# Patient Record
Sex: Male | Born: 1970 | Race: Black or African American | Hispanic: No | Marital: Single | Smoking: Current every day smoker
Health system: Southern US, Community
[De-identification: ages and names within clinical notes are randomized; demographics above are authoritative.]

## PROBLEM LIST (undated history)

## (undated) DIAGNOSIS — F1010 Alcohol abuse, uncomplicated: Secondary | ICD-10-CM

## (undated) DIAGNOSIS — F1410 Cocaine abuse, uncomplicated: Secondary | ICD-10-CM

## (undated) DIAGNOSIS — K859 Acute pancreatitis without necrosis or infection, unspecified: Secondary | ICD-10-CM

## (undated) HISTORY — PX: ABDOMINAL SURGERY: SHX537

---

## 2015-07-13 ENCOUNTER — Emergency Department (HOSPITAL_BASED_OUTPATIENT_CLINIC_OR_DEPARTMENT_OTHER)
Admission: EM | Admit: 2015-07-13 | Discharge: 2015-07-13 | Disposition: A | Discharge status: Home / Self Care | Payer: Self-pay | Attending: Emergency Medicine | Admitting: Emergency Medicine

## 2015-07-13 ENCOUNTER — Encounter (HOSPITAL_BASED_OUTPATIENT_CLINIC_OR_DEPARTMENT_OTHER): Payer: Self-pay

## 2015-07-13 DIAGNOSIS — R112 Nausea with vomiting, unspecified: Secondary | ICD-10-CM | POA: Insufficient documentation

## 2015-07-13 DIAGNOSIS — Z87891 Personal history of nicotine dependence: Secondary | ICD-10-CM | POA: Insufficient documentation

## 2015-07-13 DIAGNOSIS — K860 Alcohol-induced chronic pancreatitis: Secondary | ICD-10-CM | POA: Insufficient documentation

## 2015-07-13 HISTORY — DX: Acute pancreatitis without necrosis or infection, unspecified: K85.90

## 2015-07-13 HISTORY — DX: Alcohol abuse, uncomplicated: F10.10

## 2015-07-13 HISTORY — DX: Cocaine abuse, uncomplicated: F14.10

## 2015-07-13 LAB — ETHANOL: Alcohol, Ethyl (B): 29 mg/dL — ABNORMAL HIGH (ref <5)

## 2015-07-13 LAB — COMPREHENSIVE METABOLIC PANEL
ALBUMIN: 4.1 g/dL (ref 3.5–5.0)
ALT: 12 U/L — AB (ref 17–63)
AST: 21 U/L (ref 15–41)
Alkaline Phosphatase: 68 U/L (ref 38–126)
Anion gap: 9 (ref 5–15)
BUN: 8 mg/dL (ref 6–20)
CHLORIDE: 103 mmol/L (ref 101–111)
CO2: 27 mmol/L (ref 22–32)
CREATININE: 0.87 mg/dL (ref 0.61–1.24)
Calcium: 9.4 mg/dL (ref 8.9–10.3)
GFR calc Af Amer: >60 mL/min (ref >60)
GLUCOSE: 91 mg/dL (ref 65–99)
POTASSIUM: 3.5 mmol/L (ref 3.5–5.1)
SODIUM: 139 mmol/L (ref 135–145)
Total Bilirubin: 0.7 mg/dL (ref 0.3–1.2)
Total Protein: 7.9 g/dL (ref 6.5–8.1)

## 2015-07-13 LAB — CBC WITH DIFFERENTIAL/PLATELET
BASOS ABS: 0 10*3/uL (ref 0.0–0.1)
BASOS PCT: 0 %
EOS ABS: 0.1 10*3/uL (ref 0.0–0.7)
EOS PCT: 1 %
HCT: 37.1 % — ABNORMAL LOW (ref 39.0–52.0)
Hemoglobin: 12.9 g/dL — ABNORMAL LOW (ref 13.0–17.0)
LYMPHS PCT: 23 %
Lymphs Abs: 2.1 10*3/uL (ref 0.7–4.0)
MCH: 28.5 pg (ref 26.0–34.0)
MCHC: 34.8 g/dL (ref 30.0–36.0)
MCV: 82.1 fL (ref 78.0–100.0)
MONO ABS: 0.6 10*3/uL (ref 0.1–1.0)
Monocytes Relative: 7 %
Neutro Abs: 6.1 10*3/uL (ref 1.7–7.7)
Neutrophils Relative %: 69 %
PLATELETS: 294 10*3/uL (ref 150–400)
RBC: 4.52 MIL/uL (ref 4.22–5.81)
RDW: 16 % — AB (ref 11.5–15.5)
WBC: 8.9 10*3/uL (ref 4.0–10.5)

## 2015-07-13 LAB — LIPASE, BLOOD: LIPASE: 336 U/L — AB (ref 11–51)

## 2015-07-13 MED ORDER — ONDANSETRON HCL 4 MG/2ML IJ SOLN
4.0000 mg | Freq: Once | INTRAMUSCULAR | Status: AC
  Administered 2015-07-13: 4 mg via INTRAVENOUS
  Filled 2015-07-13: qty 2

## 2015-07-13 MED ORDER — MORPHINE SULFATE (PF) 4 MG/ML IV SOLN
4.0000 mg | Freq: Once | INTRAVENOUS | Status: DC
  Filled 2015-07-13: qty 1

## 2015-07-13 MED ORDER — IBUPROFEN 800 MG PO TABS: 800.0000 mg | ORAL_TABLET | Freq: Three times a day (TID) | ORAL | Status: AC

## 2015-07-13 MED ORDER — ONDANSETRON 4 MG PO TBDP: 4.0000 mg | ORAL_TABLET | Freq: Three times a day (TID) | ORAL | Status: AC | PRN

## 2015-07-13 MED ORDER — SODIUM CHLORIDE 0.9 % IV BOLUS (SEPSIS)
1000.0000 mL | Freq: Once | INTRAVENOUS | Status: AC
  Administered 2015-07-13: 1000 mL via INTRAVENOUS

## 2015-07-13 MED ORDER — KETOROLAC TROMETHAMINE 30 MG/ML IJ SOLN
30.0000 mg | Freq: Once | INTRAMUSCULAR | Status: AC
  Administered 2015-07-13: 30 mg via INTRAVENOUS
  Filled 2015-07-13: qty 1

## 2015-07-13 MED ORDER — OXYCODONE-ACETAMINOPHEN 5-325 MG PO TABS
2.0000 | ORAL_TABLET | Freq: Once | ORAL | Status: AC
  Administered 2015-07-13: 2 via ORAL
  Filled 2015-07-13: qty 2

## 2015-07-13 NOTE — ED Notes (Signed)
;  pt c/o "pancreatitis"-abd pain, n/v since yesterday-was [facility] yesterday

## 2015-07-13 NOTE — Discharge Instructions (Signed)
Take the prescribed medication as directed. You have to stop drinking alcohol if you ever want this pancreatitis to stop recurring. [facility]-- call to make appt.  This clinic is FREE! Return to the ED for new or worsening symptoms.

## 2015-07-13 NOTE — ED Provider Notes (Signed)
CSN: [id number]     Arrival date & time 07/13/15  1703 History  By signing my name below, I, Placido SouLogan Joldersma, attest that this documentation has been prepared under the direction and in the presence of Garlon HatchetLisa M Hien Perreira, PA-C. Electronically Signed: Placido SouLogan Joldersma, ED Scribe. 07/13/2015. 5:50 PM.   Chief Complaint  Patient presents with  . Abdominal Pain   The history is provided by the patient. No language interpreter was used.   HPI Comments: Travis Schroeder is a 45 y.o. male with a PMHx of pancreatitis and ETOH abuse who presents to the Emergency Department complaining of constant, moderate, epigastric pain x 3 days. Patient states he has been battling intermittent pancreatitis for the past 2 years.  Pt confirms that he was seen at [facility] and was admitted for pancreatitis and left yesterday morning stating "they did nothing for me but call me a drunk". He notes he consumed ETOH after leaving the hospital. His pain worsens with palpation. Pt reports associated n/v. 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 Pt denies having been seen by a PCP or GI doctor. He denies any other associated symptoms at this time.   Past Medical History  Diagnosis Date  . Pancreatitis   . ETOH abuse   . Cocaine abuse    History reviewed. No pertinent past surgical history. No family history on file. Social History  Substance Use Topics  . Smoking status: Current Every Day Smoker  . Smokeless tobacco: None  . Alcohol Use: Yes     Comment: daily    Review of Systems  Constitutional: Negative for fever and chills.  Gastrointestinal: Positive for nausea, vomiting and abdominal pain.  All other systems reviewed and are negative.  Allergies  Review of patient's allergies indicates no known allergies.  Home Medications   Prior to Admission medications   Not on File   BP 176/101 mmHg  Pulse 62  Temp(Src) 98.3 F (36.8 C) (Oral)  Resp 18  Ht 5\' 10"  (1.778 m)  Wt 130  lb (58.968 kg)  BMI 18.65 kg/m2  SpO2 100%    Physical Exam  Constitutional: He is oriented to person, place, and time. He appears well-developed and well-nourished.  Writhing around in bed, spitting into emesis bag, no active emesis, uncooperative with exam, extremely rude with vulgar language  HENT:  Head: Normocephalic and atraumatic.  Mouth/Throat: Oropharynx is clear and moist.  Eyes: Conjunctivae and EOM are normal. Pupils are equal, round, and reactive to light.  Neck: Normal range of motion.  Cardiovascular: Normal rate, regular rhythm and normal heart sounds.   Pulmonary/Chest: Effort normal and breath sounds normal. No respiratory distress.  Abdominal: Soft. Bowel sounds are normal. There is tenderness in the epigastric area. There is no rigidity, no guarding and no CVA tenderness.  Pushing my hand away during exam Tenderness in epigastrium without rebound or peritonitis  Musculoskeletal: Normal range of motion.  Neurological: He is alert and oriented to person, place, and time.  Skin: Skin is warm and dry.  Psychiatric: He has a normal mood and affect.  Nursing note and vitals reviewed.   ED Course  Procedures  DIAGNOSTIC STUDIES: Oxygen Saturation is 100% on RA, normal by my interpretation.    COORDINATION OF CARE: 5:47 PM Discussed next steps with pt. Pt verbalized understanding and is agreeable with the plan.   Labs Review Labs Reviewed  CBC WITH DIFFERENTIAL/PLATELET - Abnormal; Notable for the following:  Hemoglobin 12.9 (*)    HCT 37.1 (*)    RDW 16.0 (*)    All other components within normal limits  COMPREHENSIVE METABOLIC PANEL - Abnormal; Notable for the following:    ALT 12 (*)    All other components within normal limits  LIPASE, BLOOD - Abnormal; Notable for the following:    Lipase 336 (*)    All other components within normal limits  ETHANOL - Abnormal; Notable for the following:    Alcohol, Ethyl (B) 29 (*)    All other components within  normal limits    Imaging Review No results found. I have personally reviewed and evaluated these images and lab results as part of my medical decision-making.   EKG Interpretation None      MDM   Final diagnoses:  Alcohol-induced chronic pancreatitis (HCC)   45 year old male here with chronic alcohol induced pancreatitis. Patient has been extremely rude and very verbally abusive to staff throughout ED visit.  He is afebrile and nontoxic in appearance. He was uncooperative with exam and making it very difficult to assess. He does have some apparent tenderness in his epigastrium. Labs as above-- lipase elevated as well as ethanol. Patient was treated with IV fluids, Toradol, Zofran, and oral Percocet. He initially refused any medications that "weren't Dilaudid" but reluctantly took the oral medications. He was able to hold down medications and sprite without any further vomiting, currently lying in bed comfortably. Do not feel he needs repeat admission at this time.  I had a long discussion with patient regarding his chronic alcohol abuse and that he will continue to have recurrent episodes of pancreatitis if he continues to drink. He states he does not have any insurance or money to see a primary care doctor so [facility].  Rx motrin, zofran.  Discussed plan with patient, he/she acknowledged understanding and agreed with plan of care.  Return precautions given for new or worsening symptoms.  I personally performed the services described in this documentation, which was scribed in my presence. The recorded information has been reviewed and is accurate.  Garlon HatchetLisa M Layken Doenges, PA-C 07/13/15 2027  Vanetta MuldersScott Zackowski, MD 07/15/15 [phone]

## 2016-06-10 ENCOUNTER — Emergency Department (HOSPITAL_BASED_OUTPATIENT_CLINIC_OR_DEPARTMENT_OTHER)
Admission: EM | Admit: 2016-06-10 | Discharge: 2016-06-11 | Disposition: A | Discharge status: Other Acute Inpatient Hospital | Payer: Self-pay

## 2016-06-10 ENCOUNTER — Emergency Department (HOSPITAL_BASED_OUTPATIENT_CLINIC_OR_DEPARTMENT_OTHER): Payer: Self-pay

## 2016-06-10 ENCOUNTER — Encounter (HOSPITAL_BASED_OUTPATIENT_CLINIC_OR_DEPARTMENT_OTHER): Payer: Self-pay | Admitting: *Deleted

## 2016-06-10 DIAGNOSIS — K861 Other chronic pancreatitis: Secondary | ICD-10-CM

## 2016-06-10 DIAGNOSIS — F17200 Nicotine dependence, unspecified, uncomplicated: Secondary | ICD-10-CM | POA: Insufficient documentation

## 2016-06-10 DIAGNOSIS — K859 Acute pancreatitis without necrosis or infection, unspecified: Secondary | ICD-10-CM | POA: Insufficient documentation

## 2016-06-10 DIAGNOSIS — R52 Pain, unspecified: Secondary | ICD-10-CM

## 2016-06-10 LAB — COMPREHENSIVE METABOLIC PANEL
ALT: 17 U/L (ref 17–63)
ANION GAP: 10 (ref 5–15)
AST: 28 U/L (ref 15–41)
Albumin: 4.4 g/dL (ref 3.5–5.0)
Alkaline Phosphatase: 59 U/L (ref 38–126)
BUN: 8 mg/dL (ref 6–20)
CHLORIDE: 98 mmol/L — AB (ref 101–111)
CO2: 28 mmol/L (ref 22–32)
Calcium: 9.4 mg/dL (ref 8.9–10.3)
Creatinine, Ser: 0.73 mg/dL (ref 0.61–1.24)
GFR calc non Af Amer: >60 mL/min (ref >60)
Glucose, Bld: 81 mg/dL (ref 65–99)
POTASSIUM: 3.2 mmol/L — AB (ref 3.5–5.1)
SODIUM: 136 mmol/L (ref 135–145)
Total Bilirubin: 1.8 mg/dL — ABNORMAL HIGH (ref 0.3–1.2)
Total Protein: 7.9 g/dL (ref 6.5–8.1)

## 2016-06-10 LAB — URINALYSIS, ROUTINE W REFLEX MICROSCOPIC
Bilirubin Urine: NEGATIVE
Glucose, UA: NEGATIVE mg/dL
HGB URINE DIPSTICK: NEGATIVE
Ketones, ur: NEGATIVE mg/dL
Leukocytes, UA: NEGATIVE
Nitrite: NEGATIVE
Protein, ur: NEGATIVE mg/dL
SPECIFIC GRAVITY, URINE: 1.017 (ref 1.005–1.030)
pH: 8 (ref 5.0–8.0)

## 2016-06-10 LAB — CBC WITH DIFFERENTIAL/PLATELET
Basophils Absolute: 0.1 10*3/uL (ref 0.0–0.1)
Basophils Relative: 1 %
EOS ABS: 0.2 10*3/uL (ref 0.0–0.7)
Eosinophils Relative: 2 %
HCT: 33.7 % — ABNORMAL LOW (ref 39.0–52.0)
HEMOGLOBIN: 11.7 g/dL — AB (ref 13.0–17.0)
LYMPHS ABS: 3.1 10*3/uL (ref 0.7–4.0)
LYMPHS PCT: 27 %
MCH: 29.3 pg (ref 26.0–34.0)
MCHC: 34.7 g/dL (ref 30.0–36.0)
MCV: 84.3 fL (ref 78.0–100.0)
Monocytes Absolute: 0.6 10*3/uL (ref 0.1–1.0)
Monocytes Relative: 5 %
NEUTROS PCT: 65 %
Neutro Abs: 7.5 10*3/uL (ref 1.7–7.7)
Platelets: 419 10*3/uL — ABNORMAL HIGH (ref 150–400)
RBC: 4 MIL/uL — AB (ref 4.22–5.81)
RDW: 14.4 % (ref 11.5–15.5)
WBC: 11.5 10*3/uL — AB (ref 4.0–10.5)

## 2016-06-10 LAB — LIPASE, BLOOD: Lipase: 206 U/L — ABNORMAL HIGH (ref 11–51)

## 2016-06-10 MED ORDER — ONDANSETRON HCL 4 MG/2ML IJ SOLN
4.0000 mg | Freq: Once | INTRAMUSCULAR | Status: AC
  Administered 2016-06-10: 4 mg via INTRAVENOUS
  Filled 2016-06-10: qty 2

## 2016-06-10 MED ORDER — HYDROMORPHONE HCL 1 MG/ML IJ SOLN
1.0000 mg | Freq: Once | INTRAMUSCULAR | Status: AC
  Administered 2016-06-10: 1 mg via INTRAVENOUS
  Filled 2016-06-10: qty 1

## 2016-06-10 MED ORDER — GI COCKTAIL ~~LOC~~
30.0000 mL | Freq: Once | ORAL | Status: AC
  Administered 2016-06-10: 30 mL via ORAL
  Filled 2016-06-10: qty 30

## 2016-06-10 MED ORDER — SODIUM CHLORIDE 0.9 % IV BOLUS (SEPSIS)
1000.0000 mL | Freq: Once | INTRAVENOUS | Status: AC
  Administered 2016-06-10: 1000 mL via INTRAVENOUS

## 2016-06-10 MED ORDER — MORPHINE SULFATE (PF) 4 MG/ML IV SOLN
4.0000 mg | Freq: Once | INTRAVENOUS | Status: AC
  Administered 2016-06-10: 4 mg via INTRAVENOUS
  Filled 2016-06-10: qty 1

## 2016-06-10 NOTE — ED Triage Notes (Signed)
Abdominal pain. States he thinks he has pancreatitis. He was seen at [facility] a week ago and treated for pancreatitis. He had a cyst drained while in the hospital. States he was [facility] yesterday after eating fried food and given pain medication and sent home.

## 2016-06-10 NOTE — ED Provider Notes (Signed)
[facility] Provider Note   CSN: [id number] Arrival date & time: 06/10/16  1851  By signing my name below, I, Travis Schroeder, attest that this documentation has been prepared under the direction and in the presence of [name], PA-C. Electronically Signed: Deland Schroeder, ED Scribe. 06/10/16. 8:05 PM.  History   Chief Complaint Chief Complaint  Patient presents with  . Abdominal Pain   The history is provided by the patient. No language interpreter was used.   HPI Comments: Travis Schroeder is a 46 y.o. male, with a h/x of pancreatitis and EtOH abuse, who presents to the Emergency Department complaining of severe constant right abdominal pain and epigastric pain with radiating back pain. He indicates that he went to [facility] yesterday and was given pain medication (percocets), a CT, and was then sent home.  He was diagnosed with acute on chronic pancreatitis. Pt does indorse drinking a large beer but that is it. The pt's last normal BM was this morning. He reports of a PMHx of 3 cysts on top of pancreas that were drained last month at [facility] by IR. He has bee trying percocet and zofran at home with little relief. He denies any fever, Lightheadedness, dizziness, urinary symptoms, melena, hematochezia, diarrhea, chest pain, shortness of breath.  Past Medical History:  Diagnosis Date  . Cocaine abuse   . ETOH abuse   . Pancreatitis     There are no active problems to display for this patient.   History reviewed. No pertinent surgical history.     Home Medications    Prior to Admission medications   Medication Sig Start Date End Date Taking? Authorizing Provider  AMLODIPINE BESYLATE PO Take by mouth.   Yes [provider]  GABAPENTIN PO Take by mouth.   Yes [provider]  MAGNESIUM PO Take by mouth.   Yes [provider]  Oxycodone-Acetaminophen (PERCOCET PO) Take by mouth.   Yes [provider]  TRAMADOL HCL PO Take by mouth.   Yes [provider]  ibuprofen (ADVIL,MOTRIN) 800 MG tablet Take 1 tablet (800 mg total) by mouth 3 (three) times daily. 07/13/15   Garlon Hatchet, PA-C  ondansetron (ZOFRAN ODT) 4 MG disintegrating tablet Take 1 tablet (4 mg total) by mouth every 8 (eight) hours as needed for nausea. 07/13/15   Garlon Hatchet, PA-C    Family History No family history on file.  Social History Social History  Substance Use Topics  . Smoking status: Current Every Day Smoker  . Smokeless tobacco: Never Used  . Alcohol use Yes     Comment: daily     Allergies   Patient has no known allergies.   Review of Systems Review of Systems  Constitutional: Positive for appetite change. Negative for chills and fever.  Respiratory: Negative for shortness of breath.   Cardiovascular: Negative for chest pain.  Gastrointestinal: Positive for abdominal pain, nausea and vomiting. Negative for blood in stool and diarrhea.  Genitourinary: Negative for dysuria, flank pain, frequency, hematuria and urgency.  Skin: Negative.   Neurological: Negative for dizziness, light-headedness and headaches.     Physical Exam Updated Vital Signs BP (!) 142/97 (BP Location: Right Arm)   Pulse 87   Temp 98.7 F (37.1 C) (Oral)   Resp 18   Ht 5\' 10"  (1.778 m)   Wt 130 lb (59 kg)   SpO2 100%   BMI 18.65 kg/m   Physical Exam  Constitutional: He  is oriented to person, place, and time. He appears well-developed and well-nourished.  Rolling around in bed crying. In pain. Non-toxic appearing.  HENT:  Head: Normocephalic.  Eyes: EOM are normal.  Neck: Normal range of motion. Neck supple.  Cardiovascular: Normal rate, regular rhythm, normal heart sounds and intact distal pulses.   Pulmonary/Chest: Effort normal and breath sounds normal.  Abdominal: Soft. Bowel sounds are normal. He exhibits no distension. There is tenderness in the right upper quadrant, epigastric area  and left upper quadrant. There is guarding. There is no rigidity, no rebound and no CVA tenderness.  Tender to palpation epigastric region in RUQ. No  rebound or rigidity. No CVA tenderness. Bowel sounds are normal.  Musculoskeletal: Normal range of motion.  Neurological: He is alert and oriented to person, place, and time.  Skin: Skin is warm and dry. Capillary refill takes less than 2 seconds.  Psychiatric: He has a normal mood and affect.  Nursing note and vitals reviewed.    ED Treatments / Results   DIAGNOSTIC STUDIES: Oxygen Saturation is 100% on RA, normal by my interpretation.   COORDINATION OF CARE: 7:43 PM-Discussed next steps with pt. Pt verbalized understanding and is agreeable with the plan.   Labs (all labs ordered are listed, but only abnormal results are displayed) Labs Reviewed  COMPREHENSIVE METABOLIC PANEL - Abnormal; Notable for the following:       Result Value   Potassium 3.2 (*)    Chloride 98 (*)    Total Bilirubin 1.8 (*)    All other components within normal limits  LIPASE, BLOOD - Abnormal; Notable for the following:    Lipase 206 (*)    All other components within normal limits  CBC WITH DIFFERENTIAL/PLATELET - Abnormal; Notable for the following:    WBC 11.5 (*)    RBC 4.00 (*)    Hemoglobin 11.7 (*)    HCT 33.7 (*)    Platelets 419 (*)    All other components within normal limits  URINALYSIS, ROUTINE W REFLEX MICROSCOPIC    EKG  EKG Interpretation None       Radiology Dg Abdomen Acute W/chest  Result Date: 06/10/2016 CLINICAL DATA:  Right-sided abdominal pain EXAM: DG ABDOMEN ACUTE W/ 1V CHEST COMPARISON:  06/10/2016, 06/09/2016 FINDINGS: Single-view chest demonstrates no acute consolidation or effusion. Normal cardiomediastinal silhouette. Supine and upright views of the abdomen demonstrate no free air beneath the diaphragm. Nonobstructed bowel gas pattern. No abnormal calcification. IMPRESSION: Non obstructed bowel gas pattern. No  radiographic evidence for acute cardiopulmonary abnormality. Electronically Signed   By: Jasmine Pang M.D.   On: 06/10/2016 23:07   US Abdomen Limited Ruq  Result Date: 06/10/2016 CLINICAL DATA:  Severe abdominal pain EXAM: ULTRASOUND ABDOMEN LIMITED RIGHT UPPER QUADRANT COMPARISON:  CT 06/09/2016 FINDINGS: Gallbladder: No gallstones or wall thickening visualized. No sonographic Murphy sign noted by sonographer. Common bile duct: Diameter: 2 mm Liver: No focal lesion identified. Within normal limits in parenchymal echogenicity. Intermittently visualized cyst in the upper abdomen compatible with known pseudocyst. IMPRESSION: 1. Negative for gallstones or biliary dilatation 2. The patient's known pseudocyst is visualized on some of the images. Electronically Signed   By: Jasmine Pang M.D.   On: 06/10/2016 22:13    Procedures Procedures (including critical care time)  Medications Ordered in ED Medications  HYDROmorphone (DILAUDID) injection 0.5 mg (not administered)  potassium chloride SA (K-DUR,KLOR-CON) CR tablet 40 mEq (not administered)  ketorolac (TORADOL) 30 MG/ML injection 15 mg (not administered)  ondansetron (  ZOFRAN) injection 4 mg (4 mg Intravenous Given 06/10/16 1954)  HYDROmorphone (DILAUDID) injection 1 mg (1 mg Intravenous Given 06/10/16 1956)  sodium chloride 0.9 % bolus 1,000 mL (0 mLs Intravenous Stopped 06/10/16 2245)  HYDROmorphone (DILAUDID) injection 1 mg (1 mg Intravenous Given 06/10/16 2052)  gi cocktail (Maalox,Lidocaine,Donnatal) (30 mLs Oral Given 06/10/16 2144)  morphine 4 MG/ML injection 4 mg (4 mg Intravenous Given 06/10/16 2244)  morphine 4 MG/ML injection 4 mg (4 mg Intravenous Given 06/11/16 0038)  ondansetron (ZOFRAN) injection 4 mg (4 mg Intravenous Given 06/11/16 0038)     Initial Impression / Assessment and Plan / ED Course  I have reviewed the triage vital signs and the nursing notes.  Pertinent labs & imaging results that were available during my care of the patient  were reviewed by me and considered in my medical decision making (see chart for details).     Patient presents to the emergency department today with complaints of epigastric upper quadrant pain that has been ongoing since yesterday. Patient was seen at [facility] and diagnosed with acute on chronic pancreatitis. He was given pain medicine and nausea medicine which has not been helping his symptoms at home. Return to the ED for ongoing symptoms. The patient is nontoxic appearing. Vital signs are stable. He is afebrile. On exam patient is tender in the epigastric right upper quadrant region. Patient with history of pseudocyst that was drained by interventional radiology at [facility] last month. Abdomen is not rigid and no rebound noted. Patient does seem to be a significant amount of pain.  Labs returned. Mild elevated white count of 11,000. Hemoglobin is stable. Potassium slightly low at 3.2 and placed orally. UA shows no signs of infection. Lipase elevated at 206. Patient did have a CT [facility] at [facility] yesterday that did show acute pancreatitis with known pseudocyst that have slightly enlarged in size from prior CT scan. Do not feel that another CT scan is warranted at this time. Did obtain A RIGHT UPPER QUADRANT WITHOUT ANY GALLBLADDER ABNORMALITIES WERE NORMAL. X-RAY OF ABDOMEN DID NOT SHOW ANY FREE AIR CONCERNING FOR PERFORATION.  Patient has been given several rounds of pain medicine and nausea medicine and continues to be in a significant amount of pain. He is also complaining of persistent nausea and unable to keep anything down by mouth. The patient is to be admitted for pain control and nausea. Spoke with Dr. Jaci StandardKakarkandy with [facility] medicine who feels patient would benefit more from being admitted to [facility] which is where his pseudocyst were drained. The patient refused at first be transferred to [facility]. Very vocal about not being transported. Discussed that since he had  his surgery performed there that they're more equipped. Patient agrees to speak to [facility].  Spoke with Dr. Andrey CampanileWilson from [facility] who agrees to accept patient in transport to hospital bed for admission. Awaiting bed assignment and Transport at This Time.   Was called in by nursing staff this patient was being very vocal to them. Patient was using curse words per nursing staff. Went into room to de-escalate patient the patient was very loud and that probably he has been treated. Discussed the patient cannot treat nursing staff that way that they were making sure that his IV was patent. Patient states that he's continued to be in pain and we have not been treating his pain. Informed patient that I have ordered several rounds of pain medication. Discussed with pt that  I would ordered some more pain medicine but he has received quite a lot of medicine and needs to be seen by the hospitalist. Pt is calm and cooperative at this time.   EMTALA orders placed. Pt is currently hemodynamically stable and in NAD at this time.    Final Clinical Impressions(s) / ED Diagnoses   Final diagnoses:  Pain  Acute on chronic pancreatitis [facility])    New Prescriptions New Prescriptions   No medications on file   I personally performed the services described in this documentation, which was scribed in my presence. The recorded information has been reviewed and is accurate.     [name], PA-C 06/11/16 0145    Pricilla Loveless, MD 06/13/16 [phone]

## 2016-06-10 NOTE — ED Notes (Signed)
EMT attempted vs, but pt in US.

## 2016-06-10 NOTE — ED Notes (Signed)
Pt still complaint of abdominal pain.  He states that the pain med eases the pain in his back.

## 2016-06-10 NOTE — ED Notes (Signed)
Explained to pt. That the EDP will be in to speak with him after results are back.  Pt. Said he wanted more pain meds.  Pt. Received meds while in [place] earlier.

## 2016-06-10 NOTE — ED Notes (Signed)
Pt. In no distress with no shortness of breath noted.

## 2016-06-11 MED ORDER — POTASSIUM CHLORIDE CRYS ER 20 MEQ PO TBCR
40.0000 meq | EXTENDED_RELEASE_TABLET | Freq: Once | ORAL | Status: AC
  Administered 2016-06-11: 40 meq via ORAL
  Filled 2016-06-11: qty 2

## 2016-06-11 MED ORDER — HYDROMORPHONE HCL 1 MG/ML IJ SOLN
0.5000 mg | Freq: Once | INTRAMUSCULAR | Status: AC | PRN
  Administered 2016-06-11: 0.5 mg via INTRAVENOUS
  Filled 2016-06-11: qty 1

## 2016-06-11 MED ORDER — MORPHINE SULFATE (PF) 4 MG/ML IV SOLN
4.0000 mg | Freq: Once | INTRAVENOUS | Status: AC
Start: 2016-06-11 — End: 2016-06-11
  Administered 2016-06-11: 4 mg via INTRAVENOUS
  Filled 2016-06-11: qty 1

## 2016-06-11 MED ORDER — ONDANSETRON HCL 4 MG/2ML IJ SOLN
4.0000 mg | Freq: Once | INTRAMUSCULAR | Status: AC
  Administered 2016-06-11: 4 mg via INTRAVENOUS
  Filled 2016-06-11: qty 2

## 2016-06-11 MED ORDER — HYDROMORPHONE HCL 1 MG/ML IJ SOLN
0.5000 mg | Freq: Once | INTRAMUSCULAR | Status: AC
  Administered 2016-06-11: 0.5 mg via INTRAVENOUS
  Filled 2016-06-11: qty 1

## 2016-06-11 MED ORDER — KETOROLAC TROMETHAMINE 30 MG/ML IJ SOLN
15.0000 mg | Freq: Once | INTRAMUSCULAR | Status: AC
  Administered 2016-06-11: 15 mg via INTRAVENOUS
  Filled 2016-06-11: qty 1

## 2016-06-11 NOTE — ED Notes (Signed)
Phone report given to [facility] by Vinson MoselleSam Coble, RN

## 2016-06-11 NOTE — ED Notes (Signed)
Patient requested another pain med.  He was informed that he just got Morphine and Zofran at 1238 am.  He stated that it did not help but slight relief.

## 2016-06-11 NOTE — ED Notes (Signed)
ED Provider at bedside. Dr Eudelia Bunchardama at bedside to assess pt prior to transfer.

## 2016-06-11 NOTE — ED Notes (Addendum)
At approx.0025 RN Earlene Plateravis in to give Pt. Pain meds and nausea meds.  RN attempted to flush Pt. IV site.  Site did not flush well.  RN looked to see if there was another place to start another IV and Pt. Started cursing about RN just giving him his meds and then start another IV site. RN tried to explain that if the IV doesn't flush well then the meds may not infuse correctly. The Pt. Started throwing himself around in the bed acting out and cursing about wanting the pain med now. Pt. Also stiffened him self up and sat straight up in the bed speaking loudly cursing at me. Pt. Used multiple curse words in his speech. RN called for back up into the room due to RN feeling uneasy with his actions.  EDP Joselyn Glassmanyler PA C into room and spoke with Pt. As well as EMT and other staff.  Pt. Was very ugly with his actions.  Pt. Said he did not want RN Earlene PlaterDavis back in his room.  RN Earlene Plateravis gave Charge RN Sam Coble pain meds to give to the Pt. And was told how the Pt. Acted out.  RN Earlene PlaterDavis did not go back into the Pt. Room.    EDP Joselyn Glassmanyler PA C was very kind to the Pt. While caring for the Pt. As well as Dr. Criss AlvineGoldston.  Both EDPs explained the process of admission and reason for [facility] need.

## 2016-06-11 NOTE — ED Notes (Signed)
Pt given a few ice chips with verbal approval from Dr. Eudelia Bunchardama

## 2016-06-13 ENCOUNTER — Encounter (HOSPITAL_BASED_OUTPATIENT_CLINIC_OR_DEPARTMENT_OTHER): Payer: Self-pay

## 2016-06-13 DIAGNOSIS — F17200 Nicotine dependence, unspecified, uncomplicated: Secondary | ICD-10-CM | POA: Insufficient documentation

## 2016-06-13 DIAGNOSIS — K859 Acute pancreatitis without necrosis or infection, unspecified: Secondary | ICD-10-CM | POA: Insufficient documentation

## 2016-06-13 DIAGNOSIS — K861 Other chronic pancreatitis: Secondary | ICD-10-CM | POA: Insufficient documentation

## 2016-06-13 DIAGNOSIS — Z79891 Long term (current) use of opiate analgesic: Secondary | ICD-10-CM | POA: Insufficient documentation

## 2016-06-13 DIAGNOSIS — Z79899 Other long term (current) drug therapy: Secondary | ICD-10-CM | POA: Insufficient documentation

## 2016-06-13 MED ORDER — ONDANSETRON 4 MG PO TBDP
4.0000 mg | ORAL_TABLET | Freq: Once | ORAL | Status: AC
  Administered 2016-06-13: 4 mg via ORAL
  Filled 2016-06-13: qty 1

## 2016-06-13 NOTE — ED Triage Notes (Signed)
Pt c/o chronic pancreatitis, is very impatient in triage, was seen on 6/5 for same, states his pain never went away and was not relieved

## 2016-06-14 ENCOUNTER — Emergency Department (HOSPITAL_BASED_OUTPATIENT_CLINIC_OR_DEPARTMENT_OTHER)
Admission: EM | Admit: 2016-06-14 | Discharge: 2016-06-14 | Disposition: A | Discharge status: Home / Self Care | Payer: Self-pay | Attending: Emergency Medicine | Admitting: Emergency Medicine

## 2016-06-14 DIAGNOSIS — K861 Other chronic pancreatitis: Secondary | ICD-10-CM

## 2016-06-14 DIAGNOSIS — K859 Acute pancreatitis without necrosis or infection, unspecified: Secondary | ICD-10-CM

## 2016-06-14 LAB — COMPREHENSIVE METABOLIC PANEL
ALK PHOS: 63 U/L (ref 38–126)
ALT: 14 U/L — ABNORMAL LOW (ref 17–63)
AST: 26 U/L (ref 15–41)
Albumin: 4.2 g/dL (ref 3.5–5.0)
Anion gap: 9 (ref 5–15)
BUN: 8 mg/dL (ref 6–20)
CALCIUM: 9.3 mg/dL (ref 8.9–10.3)
CO2: 26 mmol/L (ref 22–32)
Chloride: 103 mmol/L (ref 101–111)
Creatinine, Ser: 0.73 mg/dL (ref 0.61–1.24)
GFR calc Af Amer: >60 mL/min (ref >60)
GLUCOSE: 88 mg/dL (ref 65–99)
POTASSIUM: 3.8 mmol/L (ref 3.5–5.1)
Sodium: 138 mmol/L (ref 135–145)
TOTAL PROTEIN: 8 g/dL (ref 6.5–8.1)
Total Bilirubin: 1 mg/dL (ref 0.3–1.2)

## 2016-06-14 LAB — CBC WITH DIFFERENTIAL/PLATELET
BASOS ABS: 0.1 10*3/uL (ref 0.0–0.1)
BASOS PCT: 1 %
Eosinophils Absolute: 0.2 10*3/uL (ref 0.0–0.7)
Eosinophils Relative: 3 %
HEMATOCRIT: 32.1 % — AB (ref 39.0–52.0)
HEMOGLOBIN: 11.2 g/dL — AB (ref 13.0–17.0)
LYMPHS PCT: 31 %
Lymphs Abs: 2.2 10*3/uL (ref 0.7–4.0)
MCH: 29.8 pg (ref 26.0–34.0)
MCHC: 34.9 g/dL (ref 30.0–36.0)
MCV: 85.4 fL (ref 78.0–100.0)
MONO ABS: 0.4 10*3/uL (ref 0.1–1.0)
MONOS PCT: 5 %
NEUTROS ABS: 4.3 10*3/uL (ref 1.7–7.7)
NEUTROS PCT: 60 %
Platelets: 323 10*3/uL (ref 150–400)
RBC: 3.76 MIL/uL — ABNORMAL LOW (ref 4.22–5.81)
RDW: 14.3 % (ref 11.5–15.5)
WBC: 7.1 10*3/uL (ref 4.0–10.5)

## 2016-06-14 LAB — URINALYSIS, ROUTINE W REFLEX MICROSCOPIC
BILIRUBIN URINE: NEGATIVE
GLUCOSE, UA: NEGATIVE mg/dL
HGB URINE DIPSTICK: NEGATIVE
KETONES UR: NEGATIVE mg/dL
Leukocytes, UA: NEGATIVE
Nitrite: NEGATIVE
Protein, ur: NEGATIVE mg/dL
Specific Gravity, Urine: 1.005 (ref 1.005–1.030)
pH: 6 (ref 5.0–8.0)

## 2016-06-14 LAB — LIPASE, BLOOD: LIPASE: 125 U/L — AB (ref 11–51)

## 2016-06-14 MED ORDER — ONDANSETRON HCL 4 MG/2ML IJ SOLN: 4.0000 mg | Freq: Once | INTRAMUSCULAR | Status: DC

## 2016-06-14 MED ORDER — HYDROMORPHONE HCL 1 MG/ML IJ SOLN
1.0000 mg | Freq: Once | INTRAMUSCULAR | Status: AC
  Administered 2016-06-14: 1 mg via INTRAVENOUS
  Filled 2016-06-14: qty 1

## 2016-06-14 MED ORDER — PROMETHAZINE HCL 25 MG PO TABS: 25.0000 mg | ORAL_TABLET | Freq: Four times a day (QID) | ORAL | 0 refills | Status: DC | PRN

## 2016-06-14 MED ORDER — SODIUM CHLORIDE 0.9 % IV BOLUS (SEPSIS)
1000.0000 mL | Freq: Once | INTRAVENOUS | Status: AC
  Administered 2016-06-14: 1000 mL via INTRAVENOUS

## 2016-06-14 MED ORDER — PROMETHAZINE HCL 25 MG/ML IJ SOLN
25.0000 mg | Freq: Once | INTRAMUSCULAR | Status: AC
  Administered 2016-06-14: 25 mg via INTRAVENOUS
  Filled 2016-06-14: qty 1

## 2016-06-14 NOTE — ED Notes (Signed)
Pt reports a sharp pain to RUQ that is new tonight. Dr Karma GanjaLinker made aware.

## 2016-06-14 NOTE — Discharge Instructions (Signed)
Return to the ED with any concerns including vomiting and not able to keep down liquids or your medications, abdominal pain especially if it localizes to the right lower abdomen, fever or chills, and decreased urine output, decreased level of alertness or lethargy, or any other alarming symptoms.  °

## 2016-06-14 NOTE — ED Notes (Signed)
ED Provider at bedside. 

## 2016-06-14 NOTE — ED Notes (Signed)
Pt states he was discharged from [facility] yesterday (Wednesday) for pain from pancreatitis. PT reports he is still having abdominal pain and vomiting.

## 2016-06-14 NOTE — ED Provider Notes (Signed)
[facility]GENCY DEPT [facility] Provider Note   CSN: [id number] Arrival date & time: 06/13/16  2226     History   Chief Complaint Chief Complaint  Patient presents with  . Abdominal Pain    HPI Travis Schroeder is a 46 y.o. male.  HPI  Pt with hx of pancreatitis presenting due to ongoing epigastric pain c/w his pancreatitis as well as nausea/vomiting.  He was seen in the ED for similar symptoms 3 days ago.  Was admitted to [facility] for further management due to ongoing pain.  He states that when he was discharged from [facility] yesterday he was having ongoing pain and was not able to eat the low fat diet that they had given him before discharge.  He states he was only able to tolerate clear liquids.  He states that they did not do anything for him and he continues to have pain.  No fever/chills. He states he has had multiple episodes of vomiting today.  There are no other associated systemic symptoms, there are no other alleviating or modifying factors.  He states he called GI today at [facility] and was told "see you in July at your followup appointment".    Past Medical History:  Diagnosis Date  . Cocaine abuse   . ETOH abuse   . Pancreatitis     There are no active problems to display for this patient.   History reviewed. No pertinent surgical history.     Home Medications    Prior to Admission medications   Medication Sig Start Date End Date Taking? Authorizing Provider  AMLODIPINE BESYLATE PO Take by mouth.    [provider]  GABAPENTIN PO Take by mouth.    [provider]  ibuprofen (ADVIL,MOTRIN) 800 MG tablet Take 1 tablet (800 mg total) by mouth 3 (three) times daily. 07/13/15   Garlon Hatchet, PA-C  MAGNESIUM PO Take by mouth.    [provider]  ondansetron (ZOFRAN ODT) 4 MG disintegrating tablet Take 1 tablet (4 mg total) by mouth every 8 (eight) hours as needed for nausea. 07/13/15   Garlon Hatchet, PA-C  Oxycodone-Acetaminophen  (PERCOCET PO) Take by mouth.    [provider]  promethazine (PHENERGAN) 25 MG tablet Take 1 tablet (25 mg total) by mouth every 6 (six) hours as needed for nausea or vomiting. 06/14/16   Jerelyn Scott, MD  TRAMADOL HCL PO Take by mouth.    [provider]    Family History No family history on file.  Social History Social History  Substance Use Topics  . Smoking status: Current Every Day Smoker  . Smokeless tobacco: Never Used  . Alcohol use Yes     Comment: daily     Allergies   Patient has no known allergies.   Review of Systems Review of Systems  ROS reviewed and all otherwise negative except for mentioned in HPI   Physical Exam Updated Vital Signs BP 133/87 (BP Location: Left Arm)   Pulse 66   Temp 98.8 F (37.1 C) (Oral)   Resp 16   Ht 5\' 10"  (1.778 m)   Wt 59 kg (130 lb)   SpO2 99%   BMI 18.65 kg/m  Vitals reviewed Physical Exam Physical Examination: General appearance - alert, chronically ill appearing, and in no distress Mental status - alert, oriented to person, place, and time Eyes - no conjunctival injection, no scleral icterus Mouth - mucous membranes moist, pharynx normal without lesions Neck - supple, no  significant adenopathy Chest - clear to auscultation, no wheezes, rales or rhonchi, symmetric air entry Heart - normal rate, regular rhythm, normal S1, S2, no murmurs, rubs, clicks or gallops Abdomen - soft, tender to palpation in epigastric region, no gaurding or rebound tenderness, nondistended, no masses or organomegaly Neurological - alert, oriented, normal speech Extremities - peripheral pulses normal, no pedal edema, no clubbing or cyanosis Skin - normal coloration and turgor, no rashes  ED Treatments / Results  Labs (all labs ordered are listed, but only abnormal results are displayed) Labs Reviewed  CBC WITH DIFFERENTIAL/PLATELET - Abnormal; Notable for the following:       Result Value   RBC 3.76 (*)    Hemoglobin  11.2 (*)    HCT 32.1 (*)    All other components within normal limits  COMPREHENSIVE METABOLIC PANEL - Abnormal; Notable for the following:    ALT 14 (*)    All other components within normal limits  LIPASE, BLOOD - Abnormal; Notable for the following:    Lipase 125 (*)    All other components within normal limits  URINALYSIS, ROUTINE W REFLEX MICROSCOPIC    EKG  EKG Interpretation None       Radiology No results found.  Procedures Procedures (including critical care time)  Medications Ordered in ED Medications  ondansetron (ZOFRAN-ODT) disintegrating tablet 4 mg (4 mg Oral Given 06/13/16 2310)  sodium chloride 0.9 % bolus 1,000 mL (0 mLs Intravenous Stopped 06/14/16 0524)  HYDROmorphone (DILAUDID) injection 1 mg (1 mg Intravenous Given 06/14/16 0047)  HYDROmorphone (DILAUDID) injection 1 mg (1 mg Intravenous Given 06/14/16 0135)  HYDROmorphone (DILAUDID) injection 1 mg (1 mg Intravenous Given 06/14/16 0207)  HYDROmorphone (DILAUDID) injection 1 mg (1 mg Intravenous Given 06/14/16 0334)  promethazine (PHENERGAN) injection 25 mg (25 mg Intravenous Given 06/14/16 0341)     Initial Impression / Assessment and Plan / ED Course  I have reviewed the triage vital signs and the nursing notes.  Pertinent labs & imaging results that were available during my care of the patient were reviewed by me and considered in my medical decision making (see chart for details).    4:04 AM pt continues to request pain medication and has had 3 doses of dilaudid.  Will give another dose of meds and call for admission. D/w [facility]rak[facility]- [facility] that [facility] complicated history and [facility].  Pt has stated he does not want to be admitted to [facility] again as he just was discharged from there yesterday and did not feel he was any better when he left.  Dr. Toniann FailKakrakandy still states that [facility] is not equipped to take care of patient  if he should have a complication of his pancreatitis.  I have discussed this with patient.  He states he would rather try to go home than be admitted to [facility].  He has not had active vomiting since getting meds in the ED.  His labs are not significantly changed from prior.    Per chart review, pt had CT scan at [facility] on 06/09/16 and pseudocysts were evaluated- the largest one was decreased in size and the others were not signfiicantly changed in size.    Discharged with strict return precautions.  Pt agreeable with plan.  Final Clinical Impressions(s) / ED Diagnoses   Final diagnoses:  Acute on chronic pancreatitis [facility](HCC)    New Prescriptions Discharge  Medication List as of 06/14/2016  4:13 AM    START taking these medications   Details  promethazine (PHENERGAN) 25 MG tablet Take 1 tablet (25 mg total) by mouth every 6 (six) hours as needed for nausea or vomiting., Starting Fri 06/14/2016, Print         Jerelyn Scott, MD 06/14/16 [phone]

## 2016-06-14 NOTE — ED Notes (Signed)
Pt trying to find ride home 

## 2016-06-14 NOTE — ED Notes (Signed)
Pt unable to UA at this time.

## 2016-06-25 ENCOUNTER — Encounter (HOSPITAL_BASED_OUTPATIENT_CLINIC_OR_DEPARTMENT_OTHER): Payer: Self-pay | Admitting: Emergency Medicine

## 2016-06-25 ENCOUNTER — Emergency Department (HOSPITAL_BASED_OUTPATIENT_CLINIC_OR_DEPARTMENT_OTHER)
Admission: EM | Admit: 2016-06-25 | Discharge: 2016-06-26 | Disposition: A | Discharge status: Home / Self Care | Payer: Self-pay | Attending: Emergency Medicine | Admitting: Emergency Medicine

## 2016-06-25 DIAGNOSIS — Z79899 Other long term (current) drug therapy: Secondary | ICD-10-CM | POA: Insufficient documentation

## 2016-06-25 DIAGNOSIS — F1410 Cocaine abuse, uncomplicated: Secondary | ICD-10-CM | POA: Insufficient documentation

## 2016-06-25 DIAGNOSIS — K859 Acute pancreatitis without necrosis or infection, unspecified: Secondary | ICD-10-CM | POA: Insufficient documentation

## 2016-06-25 DIAGNOSIS — F10229 Alcohol dependence with intoxication, unspecified: Secondary | ICD-10-CM | POA: Insufficient documentation

## 2016-06-25 DIAGNOSIS — F17210 Nicotine dependence, cigarettes, uncomplicated: Secondary | ICD-10-CM | POA: Insufficient documentation

## 2016-06-25 DIAGNOSIS — K861 Other chronic pancreatitis: Secondary | ICD-10-CM | POA: Insufficient documentation

## 2016-06-25 DIAGNOSIS — Z791 Long term (current) use of non-steroidal anti-inflammatories (NSAID): Secondary | ICD-10-CM | POA: Insufficient documentation

## 2016-06-25 LAB — CBC WITH DIFFERENTIAL/PLATELET
BASOS PCT: 0 %
Basophils Absolute: 0 10*3/uL (ref 0.0–0.1)
EOS ABS: 0.1 10*3/uL (ref 0.0–0.7)
Eosinophils Relative: 1 %
HEMATOCRIT: 34.3 % — AB (ref 39.0–52.0)
Hemoglobin: 11.9 g/dL — ABNORMAL LOW (ref 13.0–17.0)
LYMPHS ABS: 2 10*3/uL (ref 0.7–4.0)
Lymphocytes Relative: 17 %
MCH: 29.5 pg (ref 26.0–34.0)
MCHC: 34.7 g/dL (ref 30.0–36.0)
MCV: 84.9 fL (ref 78.0–100.0)
MONO ABS: 0.8 10*3/uL (ref 0.1–1.0)
MONOS PCT: 7 %
Neutro Abs: 8.4 10*3/uL — ABNORMAL HIGH (ref 1.7–7.7)
Neutrophils Relative %: 75 %
Platelets: 267 10*3/uL (ref 150–400)
RBC: 4.04 MIL/uL — ABNORMAL LOW (ref 4.22–5.81)
RDW: 14 % (ref 11.5–15.5)
WBC: 11.3 10*3/uL — ABNORMAL HIGH (ref 4.0–10.5)

## 2016-06-25 LAB — COMPREHENSIVE METABOLIC PANEL
ALBUMIN: 4.5 g/dL (ref 3.5–5.0)
ALT: 13 U/L — ABNORMAL LOW (ref 17–63)
AST: 29 U/L (ref 15–41)
Alkaline Phosphatase: 58 U/L (ref 38–126)
Anion gap: 11 (ref 5–15)
BILIRUBIN TOTAL: 0.8 mg/dL (ref 0.3–1.2)
BUN: 6 mg/dL (ref 6–20)
CO2: 28 mmol/L (ref 22–32)
Calcium: 10 mg/dL (ref 8.9–10.3)
Chloride: 98 mmol/L — ABNORMAL LOW (ref 101–111)
Creatinine, Ser: 0.74 mg/dL (ref 0.61–1.24)
GFR calc Af Amer: >60 mL/min (ref >60)
GFR calc non Af Amer: >60 mL/min (ref >60)
GLUCOSE: 117 mg/dL — AB (ref 65–99)
POTASSIUM: 3.3 mmol/L — AB (ref 3.5–5.1)
SODIUM: 137 mmol/L (ref 135–145)
TOTAL PROTEIN: 8.3 g/dL — AB (ref 6.5–8.1)

## 2016-06-25 LAB — ETHANOL: Alcohol, Ethyl (B): 108 mg/dL — ABNORMAL HIGH (ref <5)

## 2016-06-25 MED ORDER — ONDANSETRON HCL 4 MG/2ML IJ SOLN
INTRAMUSCULAR | Status: AC
  Administered 2016-06-25: 4 mg
  Filled 2016-06-25: qty 2

## 2016-06-25 MED ORDER — PANTOPRAZOLE SODIUM 40 MG IV SOLR
40.0000 mg | Freq: Once | INTRAVENOUS | Status: AC
  Administered 2016-06-25: 40 mg via INTRAVENOUS
  Filled 2016-06-25: qty 40

## 2016-06-25 NOTE — ED Triage Notes (Signed)
Patient reports that he drank and took drugs 2 -4 days ago. He reports that about 2 hours ago he started to have "pancreas" pain

## 2016-06-26 LAB — LIPASE, BLOOD: LIPASE: 1742 U/L — AB (ref 11–51)

## 2016-06-26 LAB — RAPID URINE DRUG SCREEN, HOSP PERFORMED
Amphetamines: NOT DETECTED
BARBITURATES: NOT DETECTED
BENZODIAZEPINES: NOT DETECTED
COCAINE: POSITIVE — AB
Opiates: NOT DETECTED
TETRAHYDROCANNABINOL: NOT DETECTED

## 2016-06-26 MED ORDER — PROMETHAZINE HCL 25 MG PO TABS: 25.0000 mg | ORAL_TABLET | Freq: Four times a day (QID) | ORAL | 0 refills | Status: AC | PRN

## 2016-06-26 MED ORDER — PROMETHAZINE HCL 25 MG/ML IJ SOLN
12.5000 mg | Freq: Once | INTRAMUSCULAR | Status: AC
  Administered 2016-06-26: 12.5 mg via INTRAVENOUS
  Filled 2016-06-26: qty 1

## 2016-06-26 NOTE — ED Notes (Signed)
Pt was notified and educated that no narcotics would be given at this time. Pt was treated according and protocols started when pt came to the room. Pt states if he can't get pain meds he will [facility] but states he received his dilaudid 10days ago when he was here. Pt is now on his 2nd litter of NS, no n/v at this time. Pt is not screaming out at this time.

## 2016-06-26 NOTE — ED Provider Notes (Addendum)
[facility]GENCY DEPT [facility] Provider Note: Travis Schroeder, Travis Schroeder, Travis Schroeder  CSN: [id number] MRN: [id number] ARRIVAL: 06/25/16 at 2135 ROOM: MH06/MH06   CHIEF COMPLAINT  Abdominal Pain   HISTORY OF PRESENT ILLNESS  Travis Schroeder is a 46 y.o. male alcoholic with chronic pancreatitis. He has frequent admissions to multiple hospitals for acute on chronic pancreatitis. He admitted to [facility] on June 15 and was discharged yesterday with a lipase of 203. 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 He admits to drinking alcohol after being discharged and comes here complaining of severe (10/10) epigastric pain consistent with previous episodes of pancreatitis. He also admits to using cocaine. His pain has been associated with nausea and vomiting.   This is third visit to this ED this month. On June 4 he was [facility] to [facility] where he had previously had his pseudocysts drained, as the [place] hospitalist.feel comfortable admitting him. On his June 8 visit [facility] again requested that the [facility]. The patient refused and was discharged home. He was [facility] on June 15 as noted above.  Past Medical History:  Diagnosis Date  . Cocaine abuse   . ETOH abuse   . Pancreatitis     History reviewed. No pertinent surgical history.  History reviewed. No pertinent family history.  Social History  Substance Use Topics  . Smoking status: Current Every Day Smoker  . Smokeless tobacco: Never Used  . Alcohol use Yes     Comment: daily    Prior to Admission medications   Medication Sig Start Date End Date Taking? Authorizing Provider  AMLODIPINE BESYLATE PO Take by mouth.    Provider, Historical, Travis Schroeder  GABAPENTIN PO Take by mouth.    Provider, Historical, Travis Schroeder  ibuprofen (ADVIL,MOTRIN) 800 MG tablet Take 1 tablet (800 mg total) by mouth 3 (three) times daily. 07/13/15   Garlon HatchetSanders, Lisa M,  PA-C  MAGNESIUM PO Take by mouth.    Provider, Historical, Travis Schroeder  ondansetron (ZOFRAN ODT) 4 MG disintegrating tablet Take 1 tablet (4 mg total) by mouth every 8 (eight) hours as needed for nausea. 07/13/15   Garlon HatchetSanders, Lisa M, PA-C  Oxycodone-Acetaminophen (PERCOCET PO) Take by mouth.    Provider, Historical, Travis Schroeder  promethazine (PHENERGAN) 25 MG tablet Take 1 tablet (25 mg total) by mouth every 6 (six) hours as needed for nausea or vomiting. 06/26/16   Patrick Sohm, Travis Schroeder  TRAMADOL HCL PO Take by mouth.    Provider, Historical, Travis Schroeder    Allergies Patient has no known allergies.   REVIEW OF SYSTEMS  Negative except as noted here or in the History of Present Illness.   PHYSICAL EXAMINATION  Initial Vital Signs Blood pressure (!) 138/96, pulse 98, temperature 98.3 F (36.8 C), temperature source Oral, resp. rate 18, height 5\' 10"  (1.778 m), weight 59 kg (130 lb), SpO2 100 %.  Examination General: Well-developed, thin male in no acute distress; appearance consistent with age of record HENT: normocephalic; atraumatic Eyes: pupils equal, round and reactive to light; extraocular muscles intact Neck: supple Heart: regular rate and rhythm Lungs: clear to auscultation bilaterally Abdomen: soft; nondistended; epigastric tenderness; bowel sounds present Extremities: No deformity; full range of motion; pulses normal Neurologic: Awake, alert and oriented; motor function intact in all extremities and symmetric; no facial droop Skin: Warm and dry Psychiatric: Flat affect   RESULTS  Summary of this visit's results, reviewed by myself:  EKG Interpretation  Date/Time:    Ventricular Rate:    PR Interval:    QRS Duration:   QT Interval:    QTC Calculation:   R Axis:     Text Interpretation:        Laboratory Studies: Results for orders placed or performed during the hospital encounter of 06/25/16 (from the past 24 hour(s))  CBC with Differential     Status: Abnormal   Collection Time: 06/25/16  11:10 PM  Result Value Ref Range   WBC 11.3 (H) 4.0 - 10.5 K/uL   RBC 4.04 (L) 4.22 - 5.81 MIL/uL   Hemoglobin 11.9 (L) 13.0 - 17.0 g/dL   HCT 95.2 (L) 84.1 - 32.4 %   MCV 84.9 78.0 - 100.0 fL   MCH 29.5 26.0 - 34.0 pg   MCHC 34.7 30.0 - 36.0 g/dL   RDW 40.1 02.7 - 25.3 %   Platelets 267 150 - 400 K/uL   Neutrophils Relative % 75 %   Neutro Abs 8.4 (H) 1.7 - 7.7 K/uL   Lymphocytes Relative 17 %   Lymphs Abs 2.0 0.7 - 4.0 K/uL   Monocytes Relative 7 %   Monocytes Absolute 0.8 0.1 - 1.0 K/uL   Eosinophils Relative 1 %   Eosinophils Absolute 0.1 0.0 - 0.7 K/uL   Basophils Relative 0 %   Basophils Absolute 0.0 0.0 - 0.1 K/uL  Comprehensive metabolic panel     Status: Abnormal   Collection Time: 06/25/16 11:10 PM  Result Value Ref Range   Sodium 137 135 - 145 mmol/L   Potassium 3.3 (L) 3.5 - 5.1 mmol/L   Chloride 98 (L) 101 - 111 mmol/L   CO2 28 22 - 32 mmol/L   Glucose, Bld 117 (H) 65 - 99 mg/dL   BUN 6 6 - 20 mg/dL   Creatinine, Ser 6.64 0.61 - 1.24 mg/dL   Calcium 40.3 8.9 - 47.4 mg/dL   Total Protein 8.3 (H) 6.5 - 8.1 g/dL   Albumin 4.5 3.5 - 5.0 g/dL   AST 29 15 - 41 U/L   ALT 13 (L) 17 - 63 U/L   Alkaline Phosphatase 58 38 - 126 U/L   Total Bilirubin 0.8 0.3 - 1.2 mg/dL   GFR calc non Af Amer >60 >60 mL/min   GFR calc Af Amer >60 >60 mL/min   Anion gap 11 5 - 15  Ethanol     Status: Abnormal   Collection Time: 06/25/16 11:10 PM  Result Value Ref Range   Alcohol, Ethyl (B) 108 (H) <5 mg/dL  Lipase, blood     Status: Abnormal   Collection Time: 06/25/16 11:10 PM  Result Value Ref Range   Lipase 1,742 (H) 11 - 51 U/L  Rapid urine drug screen (hospital performed)     Status: Abnormal   Collection Time: 06/25/16 11:53 PM  Result Value Ref Range   Opiates NONE DETECTED NONE DETECTED   Cocaine POSITIVE (A) NONE DETECTED   Benzodiazepines NONE DETECTED NONE DETECTED   Amphetamines NONE DETECTED NONE DETECTED   Tetrahydrocannabinol NONE DETECTED NONE DETECTED    Barbiturates NONE DETECTED NONE DETECTED   Imaging Studies: No results found.  ED COURSE  Nursing notes and initial vitals signs, including pulse oximetry, reviewed.  Vitals:   06/25/16 2139 06/25/16 2140 06/26/16 0038 06/26/16 0406  BP: (!) 138/96  138/88 133/84  Pulse: 98  89 83  Resp: 18  20 20   Temp: 98.3 F (36.8 C)  TempSrc: Oral     SpO2: 100%  100% 100%  Weight:  59 kg (130 lb)    Height:  5\' 10"  (1.778 m)     1:03 AM The patient has been given IV fluids and Zofran for nausea. He is requesting pain medication. He was told that he would not be getting pain medication as he is intoxicated and his pain is chronic and self inflicted. He was advised of his blood alcohol level, his lipase level and his drug screen showing positive for cocaine.  3:58 AM The patient has been sleeping for several hours while additional hydration has been provided. Vital signs have been stable.  4:37 AM The patient states his pain is improved and now rates it a 6 out of 10. The patient was offered admission to the hospital but he refused. He wishes to be discharged home. He is requesting a prescription for Phenergan. He was advised to limit himself to nonalcoholic clear liquids for the next 48 hours then advance his diet as tolerated. He was advised to return should his symptoms worsen or he becomes unable to stay hydrated.  PROCEDURES    ED DIAGNOSES     ICD-10-CM   1. Acute on chronic pancreatitis (HCC) K85.90    K86.1   2. Alcohol intoxication in active alcoholic with complication (HCC) F10.229   3. Cocaine abuse F14.10        Reegan Bouffard, Jonny Ruiz, Travis Schroeder 06/26/16 0440    Paula Libra, Travis Schroeder 06/26/16 [phone]

## 2016-08-06 ENCOUNTER — Encounter (HOSPITAL_BASED_OUTPATIENT_CLINIC_OR_DEPARTMENT_OTHER): Payer: Self-pay | Admitting: Emergency Medicine

## 2016-08-06 ENCOUNTER — Emergency Department (HOSPITAL_BASED_OUTPATIENT_CLINIC_OR_DEPARTMENT_OTHER)
Admission: EM | Admit: 2016-08-06 | Discharge: 2016-08-06 | Disposition: A | Discharge status: Home / Self Care | Payer: Self-pay | Attending: Emergency Medicine | Admitting: Emergency Medicine

## 2016-08-06 DIAGNOSIS — Z79899 Other long term (current) drug therapy: Secondary | ICD-10-CM | POA: Insufficient documentation

## 2016-08-06 DIAGNOSIS — F1910 Other psychoactive substance abuse, uncomplicated: Secondary | ICD-10-CM

## 2016-08-06 DIAGNOSIS — K860 Alcohol-induced chronic pancreatitis: Secondary | ICD-10-CM | POA: Insufficient documentation

## 2016-08-06 DIAGNOSIS — R1013 Epigastric pain: Secondary | ICD-10-CM | POA: Insufficient documentation

## 2016-08-06 DIAGNOSIS — F1920 Other psychoactive substance dependence, uncomplicated: Secondary | ICD-10-CM | POA: Insufficient documentation

## 2016-08-06 LAB — COMPREHENSIVE METABOLIC PANEL
ALT: 17 U/L (ref 17–63)
AST: 24 U/L (ref 15–41)
Albumin: 4.3 g/dL (ref 3.5–5.0)
Alkaline Phosphatase: 86 U/L (ref 38–126)
Anion gap: 14 (ref 5–15)
BUN: 12 mg/dL (ref 6–20)
CHLORIDE: 96 mmol/L — AB (ref 101–111)
CO2: 25 mmol/L (ref 22–32)
CREATININE: 0.87 mg/dL (ref 0.61–1.24)
Calcium: 9.7 mg/dL (ref 8.9–10.3)
GFR calc Af Amer: >60 mL/min (ref >60)
GFR calc non Af Amer: >60 mL/min (ref >60)
Glucose, Bld: 99 mg/dL (ref 65–99)
Potassium: 3.6 mmol/L (ref 3.5–5.1)
SODIUM: 135 mmol/L (ref 135–145)
Total Bilirubin: 0.7 mg/dL (ref 0.3–1.2)
Total Protein: 8.1 g/dL (ref 6.5–8.1)

## 2016-08-06 LAB — URINALYSIS, ROUTINE W REFLEX MICROSCOPIC
Glucose, UA: NEGATIVE mg/dL
Hgb urine dipstick: NEGATIVE
Ketones, ur: 15 mg/dL — AB
Leukocytes, UA: NEGATIVE
Nitrite: NEGATIVE
PH: 6 (ref 5.0–8.0)
PROTEIN: 30 mg/dL — AB
Specific Gravity, Urine: 1.026 (ref 1.005–1.030)

## 2016-08-06 LAB — RAPID URINE DRUG SCREEN, HOSP PERFORMED
AMPHETAMINES: NOT DETECTED
BARBITURATES: NOT DETECTED
Benzodiazepines: NOT DETECTED
Cocaine: POSITIVE — AB
Opiates: NOT DETECTED
TETRAHYDROCANNABINOL: POSITIVE — AB

## 2016-08-06 LAB — CBC
HCT: 39.7 % (ref 39.0–52.0)
Hemoglobin: 14.1 g/dL (ref 13.0–17.0)
MCH: 28.5 pg (ref 26.0–34.0)
MCHC: 35.5 g/dL (ref 30.0–36.0)
MCV: 80.2 fL (ref 78.0–100.0)
PLATELETS: 297 10*3/uL (ref 150–400)
RBC: 4.95 MIL/uL (ref 4.22–5.81)
RDW: 15.9 % — AB (ref 11.5–15.5)
WBC: 8.8 10*3/uL (ref 4.0–10.5)

## 2016-08-06 LAB — URINALYSIS, MICROSCOPIC (REFLEX)

## 2016-08-06 LAB — LIPASE, BLOOD: LIPASE: 94 U/L — AB (ref 11–51)

## 2016-08-06 LAB — ETHANOL

## 2016-08-06 MED ORDER — PROMETHAZINE HCL 25 MG PO TABS: 25.0000 mg | ORAL_TABLET | Freq: Four times a day (QID) | ORAL | 0 refills | Status: AC | PRN

## 2016-08-06 MED ORDER — PANTOPRAZOLE SODIUM 20 MG PO TBEC: 20.0000 mg | DELAYED_RELEASE_TABLET | Freq: Every day | ORAL | 0 refills | Status: AC

## 2016-08-06 MED ORDER — ACETAMINOPHEN 500 MG PO TABS
1000.0000 mg | ORAL_TABLET | Freq: Once | ORAL | Status: AC
  Administered 2016-08-06: 1000 mg via ORAL
  Filled 2016-08-06: qty 2

## 2016-08-06 MED ORDER — PANTOPRAZOLE SODIUM 40 MG IV SOLR
40.0000 mg | Freq: Once | INTRAVENOUS | Status: AC
  Administered 2016-08-06: 40 mg via INTRAVENOUS
  Filled 2016-08-06: qty 40

## 2016-08-06 MED ORDER — SODIUM CHLORIDE 0.9 % IV SOLN: 1000.0000 mL | INTRAVENOUS | Status: DC

## 2016-08-06 MED ORDER — PROMETHAZINE HCL 25 MG/ML IJ SOLN
25.0000 mg | Freq: Once | INTRAMUSCULAR | Status: AC
  Administered 2016-08-06: 25 mg via INTRAVENOUS
  Filled 2016-08-06: qty 1

## 2016-08-06 MED ORDER — SODIUM CHLORIDE 0.9 % IV BOLUS (SEPSIS)
1000.0000 mL | Freq: Once | INTRAVENOUS | Status: AC
  Administered 2016-08-06: 1000 mL via INTRAVENOUS

## 2016-08-06 NOTE — ED Triage Notes (Signed)
Pt reports generealized abdominal pain with radiation to the back. States it's his pancreatitis.

## 2016-08-06 NOTE — ED Provider Notes (Signed)
[facility]-EMERGENCY DEPT [facility] Provider Note   CSN: [id number] Arrival date & time: 08/06/16  1518     History   Chief Complaint Chief Complaint  Patient presents with  . Pancreatitis  . Abdominal Pain  . Emesis    HPI Travis Schroeder is a 46 y.o. male.  HPI Patient reports his pancreatitis is acting up. He reports she's having a lot of pain in his upper abdomen and radiating through to his back. It has been going on for several days. He reports last alcohol use was about 2 days ago. He states he has had nausea and vomiting as well. He reports anything he tries to eat comes back up. He reports the last thing he ate was some chicken 2 days ago. He denies diarrhea. Patient states [facility] seem to help with diarrhea which she had had earlier. He reports he does know he has a problem with alcohol and with cocaine abuse but he is having a lot of pain. Past Medical History:  Diagnosis Date  . Cocaine abuse   . ETOH abuse   . Pancreatitis     There are no active problems to display for this patient.   Past Surgical History:  Procedure Laterality Date  . ABDOMINAL SURGERY         Home Medications    Prior to Admission medications   Medication Sig Start Date End Date Taking? Authorizing Provider  AMLODIPINE BESYLATE PO Take by mouth.   Yes [provider]  GABAPENTIN PO Take by mouth.    [provider]  ibuprofen (ADVIL,MOTRIN) 800 MG tablet Take 1 tablet (800 mg total) by mouth 3 (three) times daily. 07/13/15   Garlon HatchetSanders, Lisa M, PA-C  MAGNESIUM PO Take by mouth.    [provider]  ondansetron (ZOFRAN ODT) 4 MG disintegrating tablet Take 1 tablet (4 mg total) by mouth every 8 (eight) hours as needed for nausea. 07/13/15   Garlon HatchetSanders, Lisa M, PA-C  Oxycodone-Acetaminophen (PERCOCET PO) Take by mouth.    [provider]  pantoprazole (PROTONIX) 20 MG tablet Take 1 tablet (20 mg total) by mouth daily. 08/06/16    Arby BarrettePfeiffer, Rebeccah Ivins, MD  promethazine (PHENERGAN) 25 MG tablet Take 1 tablet (25 mg total) by mouth every 6 (six) hours as needed for nausea or vomiting. 06/26/16   Molpus, Jonny RuizJohn, MD  promethazine (PHENERGAN) 25 MG tablet Take 1 tablet (25 mg total) by mouth every 6 (six) hours as needed for nausea or vomiting. 08/06/16   Arby BarrettePfeiffer, Fallon Howerter, MD  TRAMADOL HCL PO Take by mouth.    [provider]    Family History No family history on file.  Social History Social History  Substance Use Topics  . Smoking status: Current Every Day Smoker    Types: Cigarettes  . Smokeless tobacco: Never Used  . Alcohol use Yes     Comment: Hasn't had any in 2 days      Allergies   Patient has no known allergies.   Review of Systems Review of Systems 10 Systems reviewed and are negative for acute change except as noted in the HPI.   Physical Exam Updated Vital Signs BP (!) 156/101 (BP Location: Left Arm)   Pulse 87   Temp 99 F (37.2 C) (Oral)   Resp 18   SpO2 100%   Physical Exam  Constitutional: He is oriented to person, place, and time. He appears well-developed and well-nourished.  Patient is slender but clinically  well and appearance. He is nontoxic. Mental status is clear. No respiratory distress  HENT:  Head: Normocephalic and atraumatic.  Mouth/Throat: Oropharynx is clear and moist.  Eyes: EOM are normal.  Neck: Neck supple.  Cardiovascular: Normal rate, regular rhythm, normal heart sounds and intact distal pulses.   No murmur heard. Pulmonary/Chest: Effort normal and breath sounds normal. No respiratory distress.  Abdominal: Soft. He exhibits no distension. There is tenderness.  Patient endorses tenderness to palpation over all of the upper abdomen. He winces and has exaggerated pain response just as my fingers lightly contact his abdomen, before any pressure is applied.  Musculoskeletal: Normal range of motion. He exhibits no edema or tenderness.  Neurological: He is alert and  oriented to person, place, and time. No cranial nerve deficit. He exhibits normal muscle tone. Coordination normal.  Skin: Skin is warm and dry.  Psychiatric: He has a normal mood and affect.  Nursing note and vitals reviewed.    ED Treatments / Results  Labs (all labs ordered are listed, but only abnormal results are displayed) Labs Reviewed  LIPASE, BLOOD - Abnormal; Notable for the following:       Result Value   Lipase 94 (*)    All other components within normal limits  COMPREHENSIVE METABOLIC PANEL - Abnormal; Notable for the following:    Chloride 96 (*)    All other components within normal limits  CBC - Abnormal; Notable for the following:    RDW 15.9 (*)    All other components within normal limits  URINALYSIS, ROUTINE W REFLEX MICROSCOPIC - Abnormal; Notable for the following:    Color, Urine AMBER (*)    Bilirubin Urine SMALL (*)    Ketones, ur 15 (*)    Protein, ur 30 (*)    All other components within normal limits  URINALYSIS, MICROSCOPIC (REFLEX) - Abnormal; Notable for the following:    Bacteria, UA RARE (*)    Squamous Epithelial / LPF 0-5 (*)    All other components within normal limits  RAPID URINE DRUG SCREEN, HOSP PERFORMED - Abnormal; Notable for the following:    Cocaine POSITIVE (*)    Tetrahydrocannabinol POSITIVE (*)    All other components within normal limits  ETHANOL    EKG  EKG Interpretation None       Radiology No results found.  Procedures Procedures (including critical care time)  Medications Ordered in ED Medications  sodium chloride 0.9 % bolus 1,000 mL (0 mLs Intravenous Stopped 08/06/16 1714)    Followed by  0.9 %  sodium chloride infusion (not administered)  pantoprazole (PROTONIX) injection 40 mg (40 mg Intravenous Given 08/06/16 1630)  promethazine (PHENERGAN) injection 25 mg (25 mg Intravenous Given 08/06/16 1630)  sodium chloride 0.9 % bolus 1,000 mL (0 mLs Intravenous Stopped 08/06/16 1803)  acetaminophen (TYLENOL)  tablet 1,000 mg (1,000 mg Oral Given 08/06/16 1807)     Initial Impression / Assessment and Plan / ED Course  I have reviewed the triage vital signs and the nursing notes.  Pertinent labs & imaging results that were available during my care of the patient were reviewed by me and considered in my medical decision making (see chart for details).      Final Clinical Impressions(s) / ED Diagnoses   Final diagnoses:  Epigastric pain  Alcohol-induced chronic pancreatitis (HCC)  Polysubstance abuse  Patient has had problems with chronic pancreatitis and alcoholism. He has reported slowing down his alcohol consumption. Lipase is not significantly elevated relative  to prior episodes of pancreatitis. Diagnostic studies are otherwise within normal range for the patient. Clinically, he does not appear significantly dehydrated. Patient has been treated for symptoms of nausea and gastritis with Phenergan and Protonix. Patient is counseled on importance of ongoing medical care. He is counseled on following up with [facility] for basic medical care and coordination. Patient did have follow-up scheduled with gastroenterology after his hospitalization, he missed that appointment. He reports he is trying to reschedule. 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  New Prescriptions New Prescriptions   PANTOPRAZOLE (PROTONIX) 20 MG TABLET    Take 1 tablet (20 mg total) by mouth daily.   PROMETHAZINE (PHENERGAN) 25 MG TABLET    Take 1 tablet (25 mg total) by mouth every 6 (six) hours as needed for nausea or vomiting.     Arby BarrettePfeiffer, Haidyn Chadderdon, MD 08/06/16 [phone]

## 2017-02-13 ENCOUNTER — Other Ambulatory Visit: Payer: Self-pay

## 2017-02-13 ENCOUNTER — Encounter (HOSPITAL_BASED_OUTPATIENT_CLINIC_OR_DEPARTMENT_OTHER): Payer: Self-pay | Admitting: Emergency Medicine

## 2017-02-13 DIAGNOSIS — Z79899 Other long term (current) drug therapy: Secondary | ICD-10-CM | POA: Insufficient documentation

## 2017-02-13 DIAGNOSIS — G8929 Other chronic pain: Secondary | ICD-10-CM | POA: Insufficient documentation

## 2017-02-13 DIAGNOSIS — F1910 Other psychoactive substance abuse, uncomplicated: Secondary | ICD-10-CM | POA: Insufficient documentation

## 2017-02-13 DIAGNOSIS — R109 Unspecified abdominal pain: Secondary | ICD-10-CM | POA: Insufficient documentation

## 2017-02-13 DIAGNOSIS — F17210 Nicotine dependence, cigarettes, uncomplicated: Secondary | ICD-10-CM | POA: Insufficient documentation

## 2017-02-13 MED ORDER — DIPHENHYDRAMINE HCL 25 MG PO CAPS
25.00 | ORAL_CAPSULE | ORAL | Status: DC
Start: ? — End: 2017-02-13

## 2017-02-13 MED ORDER — ALUMINUM-MAGNESIUM-SIMETHICONE 200-200-20 MG/5ML PO SUSP
30.00 | ORAL | Status: DC
Start: ? — End: 2017-02-13

## 2017-02-13 MED ORDER — DIAZEPAM 5 MG PO TABS
5.00 | ORAL_TABLET | ORAL | Status: DC
Start: 2017-02-14 — End: 2017-02-13

## 2017-02-13 MED ORDER — BISACODYL 5 MG PO TBEC
10.00 | DELAYED_RELEASE_TABLET | ORAL | Status: DC
Start: ? — End: 2017-02-13

## 2017-02-13 MED ORDER — SODIUM CHLORIDE 0.9 % IV SOLN
INTRAVENOUS | Status: DC
Start: ? — End: 2017-02-13

## 2017-02-13 MED ORDER — ACETAMINOPHEN 325 MG PO TABS
650.00 | ORAL_TABLET | ORAL | Status: DC
Start: ? — End: 2017-02-13

## 2017-02-13 MED ORDER — HYDROMORPHONE HCL 1 MG/ML IJ SOLN
0.50 | INTRAMUSCULAR | Status: DC
Start: ? — End: 2017-02-13

## 2017-02-13 MED ORDER — ONDANSETRON HCL 4 MG/2ML IJ SOLN
4.00 | INTRAMUSCULAR | Status: DC
Start: ? — End: 2017-02-13

## 2017-02-13 MED ORDER — HYDROCODONE-ACETAMINOPHEN 5-325 MG PO TABS
1.00 | ORAL_TABLET | ORAL | Status: DC
Start: ? — End: 2017-02-13

## 2017-02-13 MED ORDER — OXYCODONE HCL 5 MG PO TABS
5.00 | ORAL_TABLET | ORAL | Status: DC
Start: ? — End: 2017-02-13

## 2017-02-13 MED ORDER — HYDRALAZINE HCL 20 MG/ML IJ SOLN
10.00 | INTRAMUSCULAR | Status: DC
Start: ? — End: 2017-02-13

## 2017-02-13 MED ORDER — CLONIDINE HCL 0.1 MG PO TABS
0.10 | ORAL_TABLET | ORAL | Status: DC
Start: ? — End: 2017-02-13

## 2017-02-13 MED ORDER — ZOLPIDEM TARTRATE 5 MG PO TABS
5.00 | ORAL_TABLET | ORAL | Status: DC
Start: ? — End: 2017-02-13

## 2017-02-13 MED ORDER — SORBITOL 70 % RE SOLN
30.00 | RECTAL | Status: DC
Start: ? — End: 2017-02-13

## 2017-02-13 MED ORDER — ONDANSETRON 4 MG PO TBDP
4.00 | ORAL_TABLET | ORAL | Status: DC
Start: ? — End: 2017-02-13

## 2017-02-13 NOTE — ED Triage Notes (Signed)
PT presents with c/o abdominal pain for 7 months

## 2017-02-14 ENCOUNTER — Emergency Department (HOSPITAL_BASED_OUTPATIENT_CLINIC_OR_DEPARTMENT_OTHER)
Admission: EM | Admit: 2017-02-14 | Discharge: 2017-02-14 | Disposition: A | Discharge status: Home / Self Care | Payer: Self-pay | Attending: Emergency Medicine | Admitting: Emergency Medicine

## 2017-02-14 DIAGNOSIS — R109 Unspecified abdominal pain: Secondary | ICD-10-CM

## 2017-02-14 DIAGNOSIS — F1910 Other psychoactive substance abuse, uncomplicated: Secondary | ICD-10-CM

## 2017-02-14 DIAGNOSIS — G8929 Other chronic pain: Secondary | ICD-10-CM

## 2017-02-14 LAB — CBC WITH DIFFERENTIAL/PLATELET
Basophils Absolute: 0.1 10*3/uL (ref 0.0–0.1)
Basophils Relative: 1 %
Eosinophils Absolute: 0.1 10*3/uL (ref 0.0–0.7)
Eosinophils Relative: 1 %
HEMATOCRIT: 33.9 % — AB (ref 39.0–52.0)
Hemoglobin: 11.8 g/dL — ABNORMAL LOW (ref 13.0–17.0)
LYMPHS ABS: 1.5 10*3/uL (ref 0.7–4.0)
LYMPHS PCT: 14 %
MCH: 26.1 pg (ref 26.0–34.0)
MCHC: 34.8 g/dL (ref 30.0–36.0)
MCV: 75 fL — AB (ref 78.0–100.0)
Monocytes Absolute: 0.9 10*3/uL (ref 0.1–1.0)
Monocytes Relative: 9 %
NEUTROS ABS: 7.8 10*3/uL — AB (ref 1.7–7.7)
NEUTROS PCT: 75 %
Platelets: 338 10*3/uL (ref 150–400)
RBC: 4.52 MIL/uL (ref 4.22–5.81)
RDW: 15 % (ref 11.5–15.5)
WBC: 10.4 10*3/uL (ref 4.0–10.5)

## 2017-02-14 LAB — URINALYSIS, ROUTINE W REFLEX MICROSCOPIC
GLUCOSE, UA: NEGATIVE mg/dL
Ketones, ur: 15 mg/dL — AB
Leukocytes, UA: NEGATIVE
Nitrite: NEGATIVE
Protein, ur: 100 mg/dL — AB
SPECIFIC GRAVITY, URINE: 1.025 (ref 1.005–1.030)
pH: 5.5 (ref 5.0–8.0)

## 2017-02-14 LAB — COMPREHENSIVE METABOLIC PANEL
ALT: 43 U/L (ref 17–63)
AST: 41 U/L (ref 15–41)
Albumin: 3.6 g/dL (ref 3.5–5.0)
Alkaline Phosphatase: 327 U/L — ABNORMAL HIGH (ref 38–126)
Anion gap: 13 (ref 5–15)
BUN: 14 mg/dL (ref 6–20)
CALCIUM: 9.8 mg/dL (ref 8.9–10.3)
CO2: 27 mmol/L (ref 22–32)
CREATININE: 0.8 mg/dL (ref 0.61–1.24)
Chloride: 95 mmol/L — ABNORMAL LOW (ref 101–111)
GFR calc non Af Amer: >60 mL/min (ref >60)
Glucose, Bld: 106 mg/dL — ABNORMAL HIGH (ref 65–99)
Potassium: 3.4 mmol/L — ABNORMAL LOW (ref 3.5–5.1)
Sodium: 135 mmol/L (ref 135–145)
Total Bilirubin: 1.3 mg/dL — ABNORMAL HIGH (ref 0.3–1.2)
Total Protein: 8.7 g/dL — ABNORMAL HIGH (ref 6.5–8.1)

## 2017-02-14 LAB — URINALYSIS, MICROSCOPIC (REFLEX)

## 2017-02-14 LAB — RAPID URINE DRUG SCREEN, HOSP PERFORMED
Amphetamines: NOT DETECTED
BARBITURATES: NOT DETECTED
Benzodiazepines: POSITIVE — AB
Cocaine: POSITIVE — AB
Opiates: POSITIVE — AB
Tetrahydrocannabinol: POSITIVE — AB

## 2017-02-14 LAB — LIPASE, BLOOD: Lipase: 56 U/L — ABNORMAL HIGH (ref 11–51)

## 2017-02-14 MED ORDER — OXYCODONE-ACETAMINOPHEN 5-325 MG PO TABS
2.00 | ORAL_TABLET | ORAL | Status: DC
Start: ? — End: 2017-02-14

## 2017-02-14 MED ORDER — FOLIC ACID 1 MG PO TABS
1.00 | ORAL_TABLET | ORAL | Status: DC
Start: 2017-02-13 — End: 2017-02-14

## 2017-02-14 MED ORDER — ONDANSETRON 4 MG PO TBDP: 4.0000 mg | ORAL_TABLET | Freq: Three times a day (TID) | ORAL | 0 refills | Status: AC | PRN

## 2017-02-14 MED ORDER — HYDRALAZINE HCL 10 MG PO TABS
10.00 | ORAL_TABLET | ORAL | Status: DC
Start: ? — End: 2017-02-14

## 2017-02-14 MED ORDER — HYDROCHLOROTHIAZIDE 12.5 MG PO CAPS
12.50 | ORAL_CAPSULE | ORAL | Status: DC
Start: 2017-02-13 — End: 2017-02-14

## 2017-02-14 MED ORDER — HYDROMORPHONE HCL 1 MG/ML IJ SOLN
.50 | INTRAMUSCULAR | Status: DC
Start: ? — End: 2017-02-14

## 2017-02-14 MED ORDER — SODIUM CHLORIDE 0.9 % IV BOLUS (SEPSIS)
1000.0000 mL | Freq: Once | INTRAVENOUS | Status: AC
  Administered 2017-02-14: 1000 mL via INTRAVENOUS

## 2017-02-14 MED ORDER — TEMAZEPAM 15 MG PO CAPS
15.00 | ORAL_CAPSULE | ORAL | Status: DC
Start: ? — End: 2017-02-14

## 2017-02-14 MED ORDER — HYDROCODONE-ACETAMINOPHEN 5-325 MG PO TABS
1.00 | ORAL_TABLET | ORAL | Status: DC
Start: ? — End: 2017-02-14

## 2017-02-14 MED ORDER — HYDROMORPHONE HCL 1 MG/ML IJ SOLN
1.0000 mg | Freq: Once | INTRAMUSCULAR | Status: AC
  Administered 2017-02-14: 1 mg via INTRAVENOUS
  Filled 2017-02-14: qty 1

## 2017-02-14 MED ORDER — GENERIC EXTERNAL MEDICATION
2.00 | Status: DC
Start: ? — End: 2017-02-14

## 2017-02-14 MED ORDER — ONDANSETRON 4 MG PO TBDP
4.00 | ORAL_TABLET | ORAL | Status: DC
Start: 2017-02-12 — End: 2017-02-14

## 2017-02-14 MED ORDER — ACETAMINOPHEN 325 MG PO TABS
650.00 | ORAL_TABLET | ORAL | Status: DC
Start: ? — End: 2017-02-14

## 2017-02-14 MED ORDER — METOCLOPRAMIDE HCL 10 MG PO TABS
10.00 | ORAL_TABLET | ORAL | Status: DC
Start: 2017-02-13 — End: 2017-02-14

## 2017-02-14 MED ORDER — ONDANSETRON HCL 4 MG/2ML IJ SOLN
4.00 | INTRAMUSCULAR | Status: DC
Start: ? — End: 2017-02-14

## 2017-02-14 MED ORDER — DOCUSATE SODIUM 100 MG PO CAPS
100.00 | ORAL_CAPSULE | ORAL | Status: DC
Start: ? — End: 2017-02-14

## 2017-02-14 MED ORDER — ENOXAPARIN SODIUM 40 MG/0.4ML ~~LOC~~ SOLN
40.00 | SUBCUTANEOUS | Status: DC
Start: 2017-02-12 — End: 2017-02-14

## 2017-02-14 MED ORDER — THIAMINE HCL 100 MG PO TABS
100.00 | ORAL_TABLET | ORAL | Status: DC
Start: 2017-02-13 — End: 2017-02-14

## 2017-02-14 MED ORDER — PANTOPRAZOLE SODIUM 40 MG PO TBEC
40.00 | DELAYED_RELEASE_TABLET | ORAL | Status: DC
Start: 2017-02-13 — End: 2017-02-14

## 2017-02-14 MED ORDER — LISINOPRIL 20 MG PO TABS
20.00 | ORAL_TABLET | ORAL | Status: DC
Start: 2017-02-13 — End: 2017-02-14

## 2017-02-14 MED ORDER — SODIUM CHLORIDE 0.9 % IV SOLN
INTRAVENOUS | Status: DC
Start: ? — End: 2017-02-14

## 2017-02-14 MED ORDER — AMLODIPINE BESYLATE 5 MG PO TABS
10.00 | ORAL_TABLET | ORAL | Status: DC
Start: 2017-02-13 — End: 2017-02-14

## 2017-02-14 MED ORDER — ALUMINUM-MAGNESIUM-SIMETHICONE 200-200-20 MG/5ML PO SUSP
30.00 | ORAL | Status: DC
Start: ? — End: 2017-02-14

## 2017-02-14 MED ORDER — ONDANSETRON HCL 4 MG/2ML IJ SOLN
4.0000 mg | Freq: Once | INTRAMUSCULAR | Status: AC
  Administered 2017-02-14: 4 mg via INTRAVENOUS
  Filled 2017-02-14: qty 2

## 2017-02-14 MED ORDER — GABAPENTIN 300 MG PO CAPS
300.00 | ORAL_CAPSULE | ORAL | Status: DC
Start: 2017-02-12 — End: 2017-02-14

## 2017-02-14 NOTE — ED Notes (Signed)
ED Provider at bedside. 

## 2017-02-14 NOTE — ED Notes (Signed)
C/o abd pain w n/v   Hx of same  Last etoh was 3 days

## 2017-02-14 NOTE — ED Provider Notes (Signed)
TIME SEEN: 5:48 AM  CHIEF COMPLAINT: Abdominal pain  HPI: Patient is a 47 year old male with history of chronic pancreatitis, substance abuse who is well-known at the [facility] emergency department who presents today for complaints of abdominal pain.  Described as a sharp pain that radiates into his back with nausea vomiting.  States he has had a "slight fever".  No diarrhea, bloody stools or melena.  No chest pain or shortness of breath.  Denies previous abdominal surgery.  States last drink alcohol 3 days ago.   Ct Abdomen Pelvis Wo Contrast (routine)  Result Date: 02/07/2017 CLINICAL DATA: Abdominal pain. EXAM: CT ABDOMEN AND PELVIS WITHOUT CONTRAST TECHNIQUE: Multidetector CT imaging of the abdomen and pelvis was performed following the standard protocol without IV contrast. COMPARISON: 01/11/2017 FINDINGS: Lower chest: No acute abnormality. Hepatobiliary: No focal liver abnormality is seen. No gallstones, gallbladder wall thickening, or biliary dilatation. Previously noted 6 mm low attenuation lesion in hepatic segment 6 not well visualized. Pancreas: Persistent dilation of the main pancreatic duct to 6 mm. Several circumscribed low-attenuation collections in the pancreatic head and neck, thought to represent pseudocysts have not significantly changed. The largest collection measures 3.6 cm. Persistent inflammatory peripancreatic fat stranding. Spleen: Normal in size without focal abnormality. Adrenals/Urinary Tract: Adrenal glands are unremarkable. Kidneys are normal, without renal calculi, focal lesion, or hydronephrosis. Bladder is unremarkable. Stomach/Bowel: Stomach is within normal limits. Appendix appears normal. No evidence of bowel wall thickening, distention, or inflammatory changes. Scatter sigmoid diverticulosis without evidence of diverticulitis. Vascular/Lymphatic: Sub pathologic by CT criteria retroperitoneal and mesenteric lymph nodes in the upper abdomen. Reproductive:  Prostate is unremarkable. Other: Small amount of free fluid in the pelvis. Musculoskeletal: No acute or significant osseous findings. 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 Persistent dilation of the main pancreatic duct. Small amount of free fluid in the pelvis. Previously noted circumferential distal esophageal wall thickening has resolved. Scattered sigmoid diverticulosis without evidence of diverticulitis. Electronically Signed By: Ted Mcalpineobrinka Dimitrova M.D. On: 02/07/2017 13:50       ROS: See HPI Constitutional:  fever  Eyes: no drainage  ENT: no runny nose   Cardiovascular:  no chest pain  Resp: no SOB  GI:  vomiting GU: no dysuria Integumentary: no rash  Allergy: no hives  Musculoskeletal: no leg swelling  Neurological: no slurred speech ROS otherwise negative  PAST MEDICAL HISTORY/PAST SURGICAL HISTORY:  Past Medical History:  Diagnosis Date  . Cocaine abuse (HCC)   . ETOH abuse   . Pancreatitis     MEDICATIONS:  Prior to Admission medications   Medication Sig Start Date End Date Taking? Authorizing Provider  AMLODIPINE BESYLATE PO Take by mouth.    [provider]  GABAPENTIN PO Take by mouth.    [provider]  ibuprofen (ADVIL,MOTRIN) 800 MG tablet Take 1 tablet (800 mg total) by mouth 3 (three) times daily. 07/13/15   Garlon HatchetSanders, Lisa M, PA-C  MAGNESIUM PO Take by mouth.    [provider]  ondansetron (ZOFRAN ODT) 4 MG disintegrating tablet Take 1 tablet (4 mg total) by mouth every 8 (eight) hours as needed for nausea. 07/13/15   Garlon HatchetSanders, Lisa M, PA-C  Oxycodone-Acetaminophen (PERCOCET PO) Take by mouth.    [provider]  pantoprazole (PROTONIX) 20 MG tablet Take 1 tablet (20 mg total) by mouth daily. 08/06/16   Arby BarrettePfeiffer, Marcy, MD  promethazine (PHENERGAN) 25  MG tablet Take 1 tablet (25 mg total) by mouth every 6  (six) hours as needed for nausea or vomiting. 06/26/16   Molpus, Jonny Ruiz, MD  promethazine (PHENERGAN) 25 MG tablet Take 1 tablet (25 mg total) by mouth every 6 (six) hours as needed for nausea or vomiting. 08/06/16   Arby Barrette, MD  TRAMADOL HCL PO Take by mouth.    [provider]    ALLERGIES:  No Known Allergies  SOCIAL HISTORY:  Social History   Tobacco Use  . Smoking status: Current Every Day Smoker    Types: Cigarettes  . Smokeless tobacco: Never Used  Substance Use Topics  . Alcohol use: Yes    Comment: Hasn't had any in 2 days     FAMILY HISTORY: No family history on file.  EXAM: BP (!) 145/103 (BP Location: Right Arm)   Pulse (!) 106   Temp 98.2 F (36.8 C) (Oral)   Resp 18   SpO2 98%  CONSTITUTIONAL: Alert and oriented and responds appropriately to questions.  Chronically ill-appearing.  Does not appear to be in any distress. HEAD: Normocephalic EYES: Conjunctivae clear, pupils appear equal, EOMI ENT: normal nose; moist mucous membranes NECK: Supple, no meningismus, no nuchal rigidity, no LAD  CARD: RRR; S1 and S2 appreciated; no murmurs, no clicks, no rubs, no gallops RESP: Normal chest excursion without splinting or tachypnea; breath sounds clear and equal bilaterally; no wheezes, no rhonchi, no rales, no hypoxia or respiratory distress, speaking full sentences ABD/GI: Normal bowel sounds; non-distended; soft, tender throughout the upper abdomen, no rebound, no guarding, no peritoneal signs, no hepatosplenomegaly BACK:  The back appears normal and is non-tender to palpation, there is no CVA tenderness EXT: Normal ROM in all joints; non-tender to palpation; no edema; normal capillary refill; no cyanosis, no calf tenderness or swelling    SKIN: Normal color for age and race; warm; no rash NEURO: Moves all extremities equally PSYCH: The patient's mood and manner are appropriate. Grooming and personal hygiene are appropriate.  MEDICAL DECISION MAKING:  Here with acute on chronic pancreatitis.  States this feels similar.  He has been in the waiting room for several hours and has had no vomiting.  He is afebrile here and nontoxic.  Does not appear to be in any distress.  Patient has documented history of aggressive behavior at [facility] and was very rude to staff here in our waiting room.  During my examination patient grabbed my arm aggressively.  When I asked him to not grab me patient became very loud, rude, belligerent.  I suspect there is some component of drug-seeking behavior.  He last had a CT scan at [facility] on 1 February that showed pancreatitis with known pseudocyst.  Discussed with patient that we will check labs for him today and give IV fluids and 1 round of pain and nausea medicine in the emergency department.  ED PROGRESS: Patient's lipase mildly elevated at 56.  Otherwise labs are unremarkable.  Normal LFTs.  I do not feel he needs repeat imaging today.  Mildly hypertensive but has a documented history of the same.  His drug screen was positive for opiates, cocaine, benzodiazepines and THC.  This was obtained prior to [place] giving him opiates in the emergency department.  Because of this documented history of substance abuse I do not feel comfortable discharging the patient home with narcotics.  Will discharge with prescription of Zofran.  I do not feel he warrants admission at this time.  At this time, I do not feel there is any life-threatening condition present. I have reviewed and discussed all results (EKG, imaging, lab, urine as appropriate) and exam findings with patient/family. I have reviewed nursing notes and appropriate previous records.  I feel the patient is safe to be discharged home without further emergent workup and can continue workup as an outpatient as needed. Discussed usual and customary return precautions. Patient/family verbalize understanding and are comfortable with this plan.  Outpatient follow-up  has been provided if needed. All questions have been answered.      Wilmot Quevedo, Layla Maw, DO 02/14/17 [phone]

## 2017-02-14 NOTE — Discharge Instructions (Signed)
Your lipase was only mildly elevated today at 56.  Your drug screen was positive for opiates, cocaine, benzodiazepines and marijuana.  Given your history of substance abuse I do not feel comfortable discharging you with a prescription of narcotics.  You need to establish care with a primary care physician.  Please avoid alcohol as this is likely contributing to your recurrent pancreatitis.   To find a primary care or specialty doctor please call [phone] or [phone] to access "[facility] Find a Doctor Service."  You may also go on the [facility] website at [email or web address]  There are also multiple [facility], [facility], [facility] and [facility] practices throughout the [place] that are frequently accepting new patients. You may find a clinic that is close to your home and contact them.  [facility] -  [address] [place] [phone]   [facility] -  [address] [place] [id number] [phone]   [facility] [phone]  [place] [id number] [phone]

## 2017-02-14 NOTE — ED Notes (Addendum)
Patient asked EMT if anyone was going to seem him. EMT assured the patient that we are trying our best to get everyone seen and get him a room and other patients out In the waiting room before him. Patent there isn't anyone out here with him. EMT stated there was patients in the waiting room waiting for a room just like him. Patient stated he wanted to speck to someone because this was ridiculous. EMT told him he would get a nurse out as soon as it was possible.

## 2017-02-14 NOTE — ED Notes (Signed)
No answer when called in WR.

## 2017-02-22 ENCOUNTER — Emergency Department (HOSPITAL_BASED_OUTPATIENT_CLINIC_OR_DEPARTMENT_OTHER)
Admission: EM | Admit: 2017-02-22 | Discharge: 2017-02-23 | Disposition: A | Discharge status: Home / Self Care | Payer: Self-pay | Attending: Emergency Medicine | Admitting: Emergency Medicine

## 2017-02-22 ENCOUNTER — Other Ambulatory Visit: Payer: Self-pay

## 2017-02-22 ENCOUNTER — Encounter (HOSPITAL_BASED_OUTPATIENT_CLINIC_OR_DEPARTMENT_OTHER): Payer: Self-pay | Admitting: *Deleted

## 2017-02-22 DIAGNOSIS — K860 Alcohol-induced chronic pancreatitis: Secondary | ICD-10-CM | POA: Insufficient documentation

## 2017-02-22 DIAGNOSIS — F17210 Nicotine dependence, cigarettes, uncomplicated: Secondary | ICD-10-CM | POA: Insufficient documentation

## 2017-02-22 LAB — I-STAT CG4 LACTIC ACID, ED: LACTIC ACID, VENOUS: 0.89 mmol/L (ref 0.5–1.9)

## 2017-02-22 LAB — CBC WITH DIFFERENTIAL/PLATELET
BASOS ABS: 0 10*3/uL (ref 0.0–0.1)
BASOS PCT: 0 %
EOS ABS: 0.1 10*3/uL (ref 0.0–0.7)
Eosinophils Relative: 1 %
HCT: 30.8 % — ABNORMAL LOW (ref 39.0–52.0)
HEMOGLOBIN: 10.8 g/dL — AB (ref 13.0–17.0)
Lymphocytes Relative: 24 %
Lymphs Abs: 2.3 10*3/uL (ref 0.7–4.0)
MCH: 26.3 pg (ref 26.0–34.0)
MCHC: 35.1 g/dL (ref 30.0–36.0)
MCV: 75.1 fL — ABNORMAL LOW (ref 78.0–100.0)
Monocytes Absolute: 0.7 10*3/uL (ref 0.1–1.0)
Monocytes Relative: 7 %
NEUTROS ABS: 6.8 10*3/uL (ref 1.7–7.7)
NEUTROS PCT: 68 %
Platelets: 516 10*3/uL — ABNORMAL HIGH (ref 150–400)
RBC: 4.1 MIL/uL — AB (ref 4.22–5.81)
RDW: 14.8 % (ref 11.5–15.5)
WBC: 9.9 10*3/uL (ref 4.0–10.5)

## 2017-02-22 LAB — COMPREHENSIVE METABOLIC PANEL
ALK PHOS: 250 U/L — AB (ref 38–126)
ALT: 21 U/L (ref 17–63)
ANION GAP: 11 (ref 5–15)
AST: 25 U/L (ref 15–41)
Albumin: 3.6 g/dL (ref 3.5–5.0)
BILIRUBIN TOTAL: 0.2 mg/dL — AB (ref 0.3–1.2)
BUN: 36 mg/dL — ABNORMAL HIGH (ref 6–20)
CALCIUM: 9.3 mg/dL (ref 8.9–10.3)
CO2: 26 mmol/L (ref 22–32)
CREATININE: 1.55 mg/dL — AB (ref 0.61–1.24)
Chloride: 96 mmol/L — ABNORMAL LOW (ref 101–111)
GFR, EST AFRICAN AMERICAN: 60 mL/min — AB (ref >60)
GFR, EST NON AFRICAN AMERICAN: 52 mL/min — AB (ref >60)
Glucose, Bld: 100 mg/dL — ABNORMAL HIGH (ref 65–99)
Potassium: 3.9 mmol/L (ref 3.5–5.1)
Sodium: 133 mmol/L — ABNORMAL LOW (ref 135–145)
TOTAL PROTEIN: 8.5 g/dL — AB (ref 6.5–8.1)

## 2017-02-22 LAB — LIPASE, BLOOD: LIPASE: 78 U/L — AB (ref 11–51)

## 2017-02-22 MED ORDER — ONDANSETRON HCL 4 MG/2ML IJ SOLN
4.0000 mg | Freq: Once | INTRAMUSCULAR | Status: AC
  Administered 2017-02-22: 4 mg via INTRAVENOUS
  Filled 2017-02-22: qty 2

## 2017-02-22 MED ORDER — SODIUM CHLORIDE 0.9 % IV BOLUS (SEPSIS)
1000.0000 mL | Freq: Once | INTRAVENOUS | Status: AC
  Administered 2017-02-23: 1000 mL via INTRAVENOUS

## 2017-02-22 MED ORDER — SODIUM CHLORIDE 0.9 % IV BOLUS (SEPSIS)
1000.0000 mL | Freq: Once | INTRAVENOUS | Status: AC
  Administered 2017-02-22: 1000 mL via INTRAVENOUS

## 2017-02-22 MED ORDER — FENTANYL CITRATE (PF) 100 MCG/2ML IJ SOLN
50.0000 ug | Freq: Once | INTRAMUSCULAR | Status: AC
  Administered 2017-02-22: 50 ug via INTRAVENOUS
  Filled 2017-02-22: qty 2

## 2017-02-22 MED ORDER — ONDANSETRON 4 MG PO TBDP
4.0000 mg | ORAL_TABLET | Freq: Once | ORAL | Status: AC | PRN
  Administered 2017-02-22: 4 mg via ORAL
  Filled 2017-02-22: qty 1

## 2017-02-22 NOTE — ED Triage Notes (Signed)
Pt reports hx of chronic pancreatitis. States he had been taking broth the last few days but today ate chicken and rice and pain became severe. Reports n/v x 6. Pain 10/10 in upper abdomen radiating to back

## 2017-02-23 ENCOUNTER — Emergency Department (HOSPITAL_BASED_OUTPATIENT_CLINIC_OR_DEPARTMENT_OTHER)
Admission: EM | Admit: 2017-02-23 | Discharge: 2017-02-23 | Discharge status: Against Medical Advice | Payer: Self-pay | Attending: Emergency Medicine | Admitting: Emergency Medicine

## 2017-02-23 ENCOUNTER — Other Ambulatory Visit: Payer: Self-pay

## 2017-02-23 ENCOUNTER — Encounter (HOSPITAL_BASED_OUTPATIENT_CLINIC_OR_DEPARTMENT_OTHER): Payer: Self-pay | Admitting: Emergency Medicine

## 2017-02-23 DIAGNOSIS — Z5321 Procedure and treatment not carried out due to patient leaving prior to being seen by health care provider: Secondary | ICD-10-CM | POA: Insufficient documentation

## 2017-02-23 DIAGNOSIS — R109 Unspecified abdominal pain: Secondary | ICD-10-CM | POA: Insufficient documentation

## 2017-02-23 MED ORDER — ONDANSETRON 4 MG PO TBDP
4.0000 mg | ORAL_TABLET | Freq: Once | ORAL | Status: AC | PRN
  Administered 2017-02-23: 4 mg via ORAL

## 2017-02-23 MED ORDER — HYDROMORPHONE HCL 1 MG/ML IJ SOLN
1.0000 mg | Freq: Once | INTRAMUSCULAR | Status: AC
Start: 2017-02-23 — End: 2017-02-23
  Administered 2017-02-23: 1 mg via INTRAVENOUS
  Filled 2017-02-23: qty 1

## 2017-02-23 MED ORDER — FENTANYL CITRATE (PF) 100 MCG/2ML IJ SOLN
50.0000 ug | Freq: Once | INTRAMUSCULAR | Status: AC
Start: 2017-02-23 — End: 2017-02-23
  Administered 2017-02-23: 50 ug via INTRAVENOUS
  Filled 2017-02-23: qty 2

## 2017-02-23 NOTE — ED Notes (Signed)
Pt not found.

## 2017-02-23 NOTE — ED Provider Notes (Signed)
[facility] EMERGENCY DEPARTMENT Provider Note   CSN: [id number] Arrival date & time: 02/22/17  2206     History   Chief Complaint Chief Complaint  Patient presents with  . Abdominal Pain    HPI Marshia LyDaniel Christopher Cly is a 47 y.o. male.  The history is provided by the patient.  Abdominal Pain   This is a recurrent problem. The current episode started 1 to 2 hours ago. The problem occurs constantly. The problem has been rapidly worsening. The pain is located in the epigastric region. The quality of the pain is sharp. The pain is severe. Associated symptoms include diarrhea, nausea and vomiting. Pertinent negatives include fever and hematochezia. The symptoms are aggravated by palpation. Nothing relieves the symptoms.   Patient with history of polysubstance abuse, history of chronic pink otitis, presents with abdominal pain.  He reports about a 1-2 hours ago he began having upper abdominal pain.  He reports it is similar to prior episodes of pancreatitis.  He does report that he stopped using cocaine and alcohol about 4 days ago, began having some abdominal pain at that time.  However it worsened over the past 1-2 hours. This is similar to prior episodes. He reports some chest pain due to the vomiting.  No fevers reported Past Medical History:  Diagnosis Date  . Cocaine abuse (HCC)   . ETOH abuse   . Pancreatitis     There are no active problems to display for this patient.   Past Surgical History:  Procedure Laterality Date  . ABDOMINAL SURGERY         Home Medications    Prior to Admission medications   Medication Sig Start Date End Date Taking? Authorizing Provider  AMLODIPINE BESYLATE PO Take by mouth.   Yes [provider]  GABAPENTIN PO Take by mouth.    [provider]  ibuprofen (ADVIL,MOTRIN) 800 MG tablet Take 1 tablet (800 mg total) by mouth 3 (three) times daily. 07/13/15   Garlon HatchetSanders, Lisa M, PA-C  MAGNESIUM PO Take by mouth.     [provider]  ondansetron (ZOFRAN ODT) 4 MG disintegrating tablet Take 1 tablet (4 mg total) by mouth every 8 (eight) hours as needed for nausea. 07/13/15   Garlon HatchetSanders, Lisa M, PA-C  ondansetron (ZOFRAN ODT) 4 MG disintegrating tablet Take 1 tablet (4 mg total) by mouth every 8 (eight) hours as needed for nausea or vomiting. 02/14/17   Ward, Layla MawKristen N, DO  Oxycodone-Acetaminophen (PERCOCET PO) Take by mouth.    [provider]  pantoprazole (PROTONIX) 20 MG tablet Take 1 tablet (20 mg total) by mouth daily. 08/06/16   Arby BarrettePfeiffer, Marcy, MD  promethazine (PHENERGAN) 25 MG tablet Take 1 tablet (25 mg total) by mouth every 6 (six) hours as needed for nausea or vomiting. 06/26/16   Molpus, Jonny RuizJohn, MD  promethazine (PHENERGAN) 25 MG tablet Take 1 tablet (25 mg total) by mouth every 6 (six) hours as needed for nausea or vomiting. 08/06/16   Arby BarrettePfeiffer, Marcy, MD  TRAMADOL HCL PO Take by mouth.    [provider]    Family History No family history on file.  Social History Social History   Tobacco Use  . Smoking status: Current Every Day Smoker    Types: Cigarettes  . Smokeless tobacco: Never Used  Substance Use Topics  . Alcohol use: Yes    Comment: Hasn't had any in 2 days   . Drug use: Yes    Types: Marijuana, Cocaine  Allergies   Topamax [topiramate]   Review of Systems Review of Systems  Constitutional: Negative for fever.  Cardiovascular:       Pain with vomiting  Gastrointestinal: Positive for abdominal pain, diarrhea, nausea and vomiting. Negative for hematochezia.  All other systems reviewed and are negative.    Physical Exam Updated Vital Signs BP 123/83   Pulse 69   Temp 98.8 F (37.1 C) (Oral)   Resp 15   SpO2 100%   Physical Exam CONSTITUTIONAL: Ill-appearing HEAD: Normocephalic/atraumatic EYES: EOMI/PERRL, no icterus ENMT: Mucous membranes dry NECK: supple no meningeal signs SPINE/BACK:entire spine nontender CV: S1/S2 noted, no  murmurs/rubs/gallops noted LUNGS: Lungs are clear to auscultation bilaterally, no apparent distress ABDOMEN: soft, diffuse tenderness noted, guarding noted.  Bowel sounds noted throughout abdomen GU:no cva tenderness NEURO: Pt is awake/alert/appropriate, moves all extremitiesx4.  No facial droop.   EXTREMITIES: pulses normal/equal, full ROM SKIN: warm, color normal PSYCH: Anxious  ED Treatments / Results  Labs (all labs ordered are listed, but only abnormal results are displayed) Labs Reviewed  COMPREHENSIVE METABOLIC PANEL - Abnormal; Notable for the following components:      Result Value   Sodium 133 (*)    Chloride 96 (*)    Glucose, Bld 100 (*)    BUN 36 (*)    Creatinine, Ser 1.55 (*)    Total Protein 8.5 (*)    Alkaline Phosphatase 250 (*)    Total Bilirubin 0.2 (*)    GFR calc non Af Amer 52 (*)    GFR calc Af Amer 60 (*)    All other components within normal limits  CBC WITH DIFFERENTIAL/PLATELET - Abnormal; Notable for the following components:   RBC 4.10 (*)    Hemoglobin 10.8 (*)    HCT 30.8 (*)    MCV 75.1 (*)    Platelets 516 (*)    All other components within normal limits  LIPASE, BLOOD - Abnormal; Notable for the following components:   Lipase 78 (*)    All other components within normal limits  I-STAT CG4 LACTIC ACID, ED    EKG  EKG Interpretation None       Radiology No results found.  Procedures Procedures   Medications Ordered in ED Medications  ondansetron (ZOFRAN-ODT) disintegrating tablet 4 mg (4 mg Oral Given 02/22/17 2217)  sodium chloride 0.9 % bolus 1,000 mL (1,000 mLs Intravenous New Bag/Given 02/23/17 0051)  fentaNYL (SUBLIMAZE) injection 50 mcg (50 mcg Intravenous Given 02/22/17 2342)  ondansetron (ZOFRAN) injection 4 mg (4 mg Intravenous Given 02/22/17 2342)  sodium chloride 0.9 % bolus 1,000 mL (1,000 mLs Intravenous New Bag/Given 02/22/17 2345)  fentaNYL (SUBLIMAZE) injection 50 mcg (50 mcg Intravenous Given 02/23/17 0049)    HYDROmorphone (DILAUDID) injection 1 mg (1 mg Intravenous Given 02/23/17 0144)     Initial Impression / Assessment and Plan / ED Course  I have reviewed the triage vital signs and the nursing notes.  Pertinent labs  results that were available during my care of the patient were reviewed by me and considered in my medical decision making (see chart for details).     12:36 AM Patient with history of recurrent pancreatitis, due to polysubstance abuse, presents with abdominal pain.  He is dehydrated, but otherwise labs are near baseline.  He has had multiple ED visits in the system as well as [facility] system.  He has had 2 CT abdomen pelvis in this year alone We will defer imaging at this time 3:18  AM Patient improved, no distress, taking p.o.  Abdomen soft but still does have some tenderness.  He feels this is similar to prior episodes.  I feel he is safe for discharge.  Discussed need to stop drinking.  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

## 2017-02-23 NOTE — ED Triage Notes (Signed)
Patient states that he continues to have  Pain - The dilaudid worked last night but then the pain came back

## 2017-02-23 NOTE — ED Notes (Signed)
Pt states he is ready to go home. IVs removed, pt dressing.

## 2017-02-23 NOTE — ED Notes (Signed)
Pt informed registration he was leaving. Pt was observed ambulating without difficulty in lobby

## 2017-02-23 NOTE — ED Notes (Signed)
Pt given water per MD order

## 2017-02-23 NOTE — ED Notes (Signed)
Pt returned to desk. States he is still here. C/o nausea

## 2017-10-10 IMAGING — CR DG ABDOMEN ACUTE W/ 1V CHEST
3 series · 3 of 3 positions shown · non-contrast
Comparison: 06/10/2016, 06/09/2016

CLINICAL DATA: Right-sided abdominal pain

EXAM:
DG ABDOMEN ACUTE W/ 1V CHEST

[w chest pa]
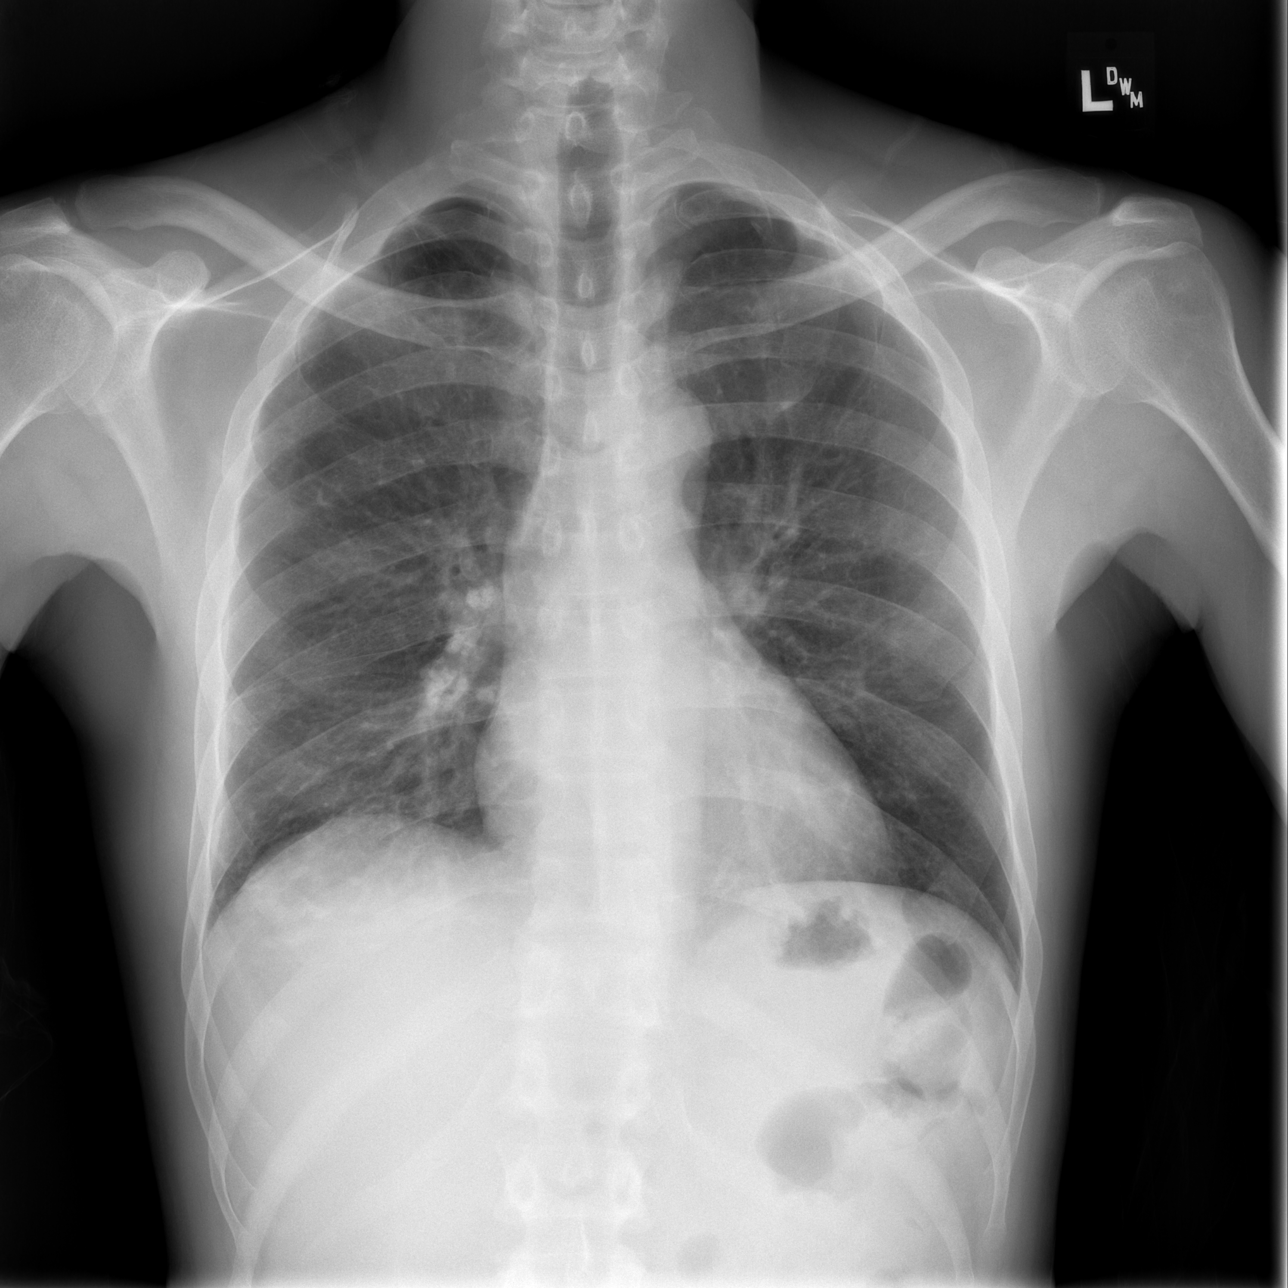

[w abdomen upright]
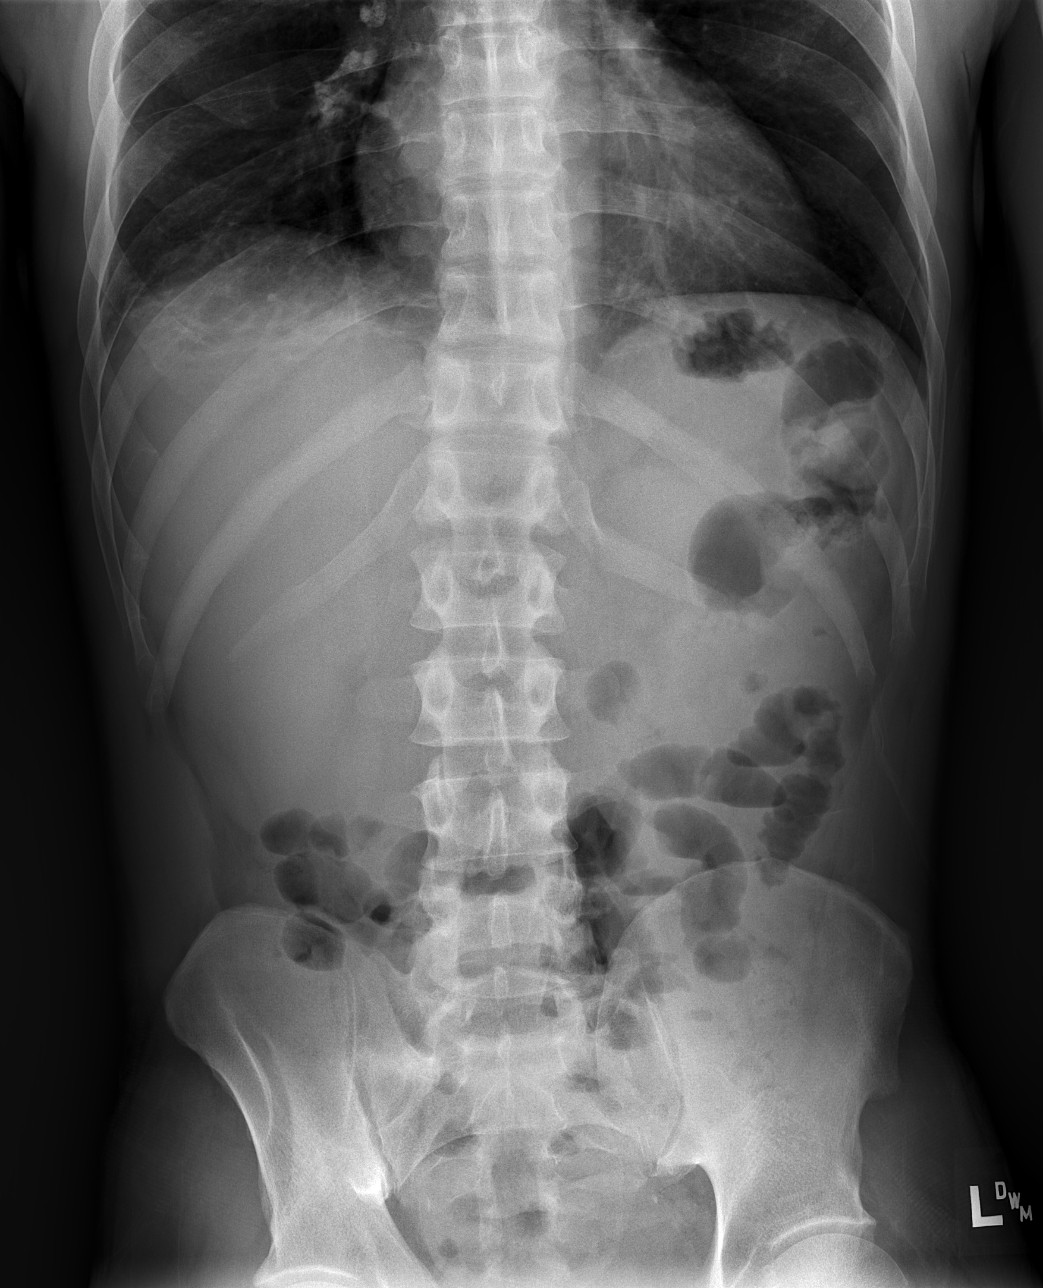

[t abdomen supine]
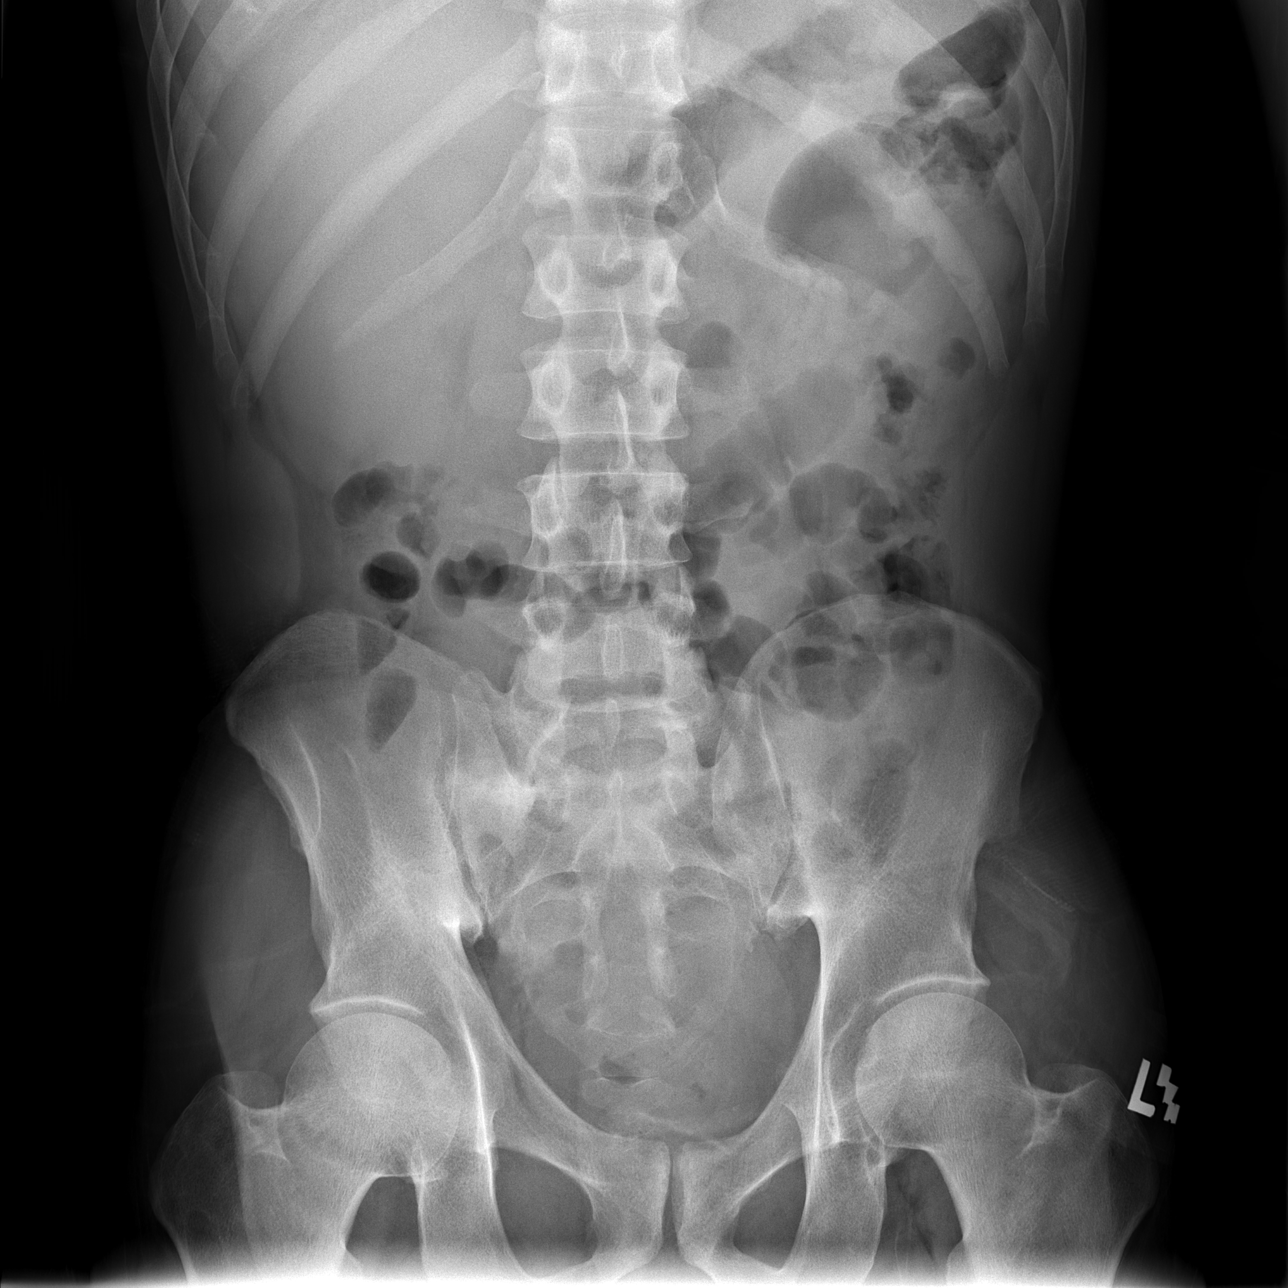

[3 of 3 positions shown; findings below may reference images not displayed]

FINDINGS: Single-view chest demonstrates no acute consolidation or effusion.
Normal cardiomediastinal silhouette.

Supine and upright views of the abdomen demonstrate no free air
beneath the diaphragm. Nonobstructed bowel gas pattern. No abnormal
calcification.
IMPRESSION: Non obstructed bowel gas pattern. No radiographic evidence for acute
cardiopulmonary abnormality.

## 2017-11-01 IMAGING — US US ABDOMEN LIMITED
1 series · 14 of 25 positions shown · non-contrast
Comparison: CT 06/09/2016

CLINICAL DATA: Severe abdominal pain

EXAM:
ULTRASOUND ABDOMEN LIMITED RIGHT UPPER QUADRANT

[Series 1: us abdomen limited · 0.22mm/px · 14 of 42 slices shown]
[im 1/42]
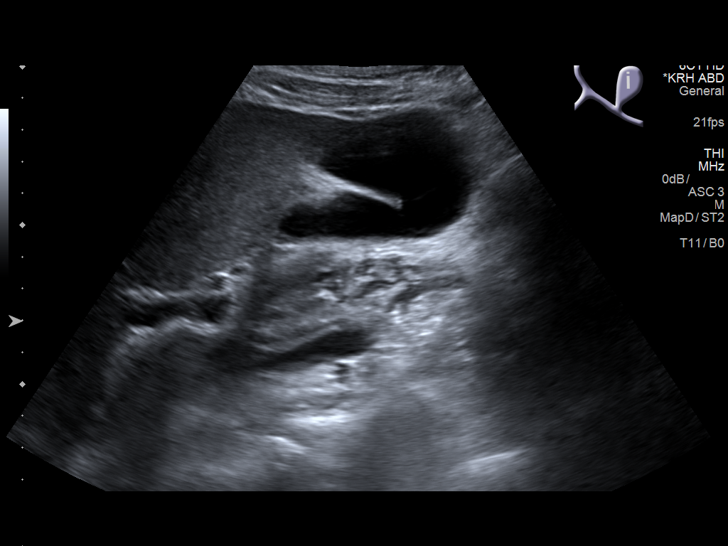
[im 4/42]
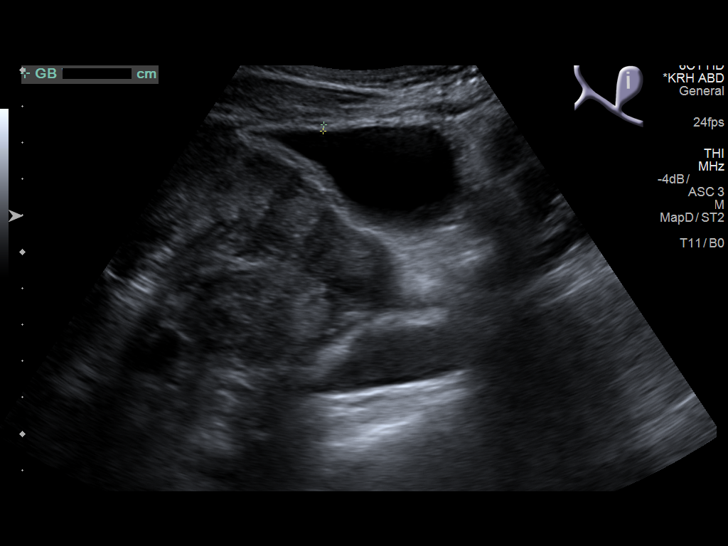
[im 7/42]
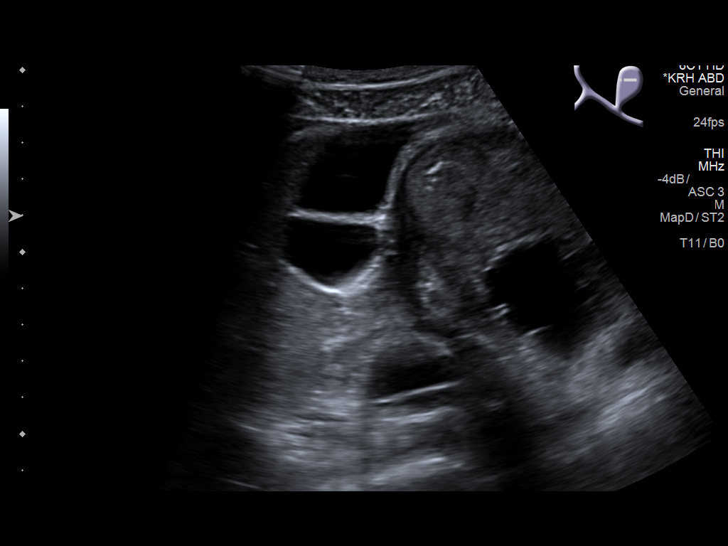
[im 11/42]
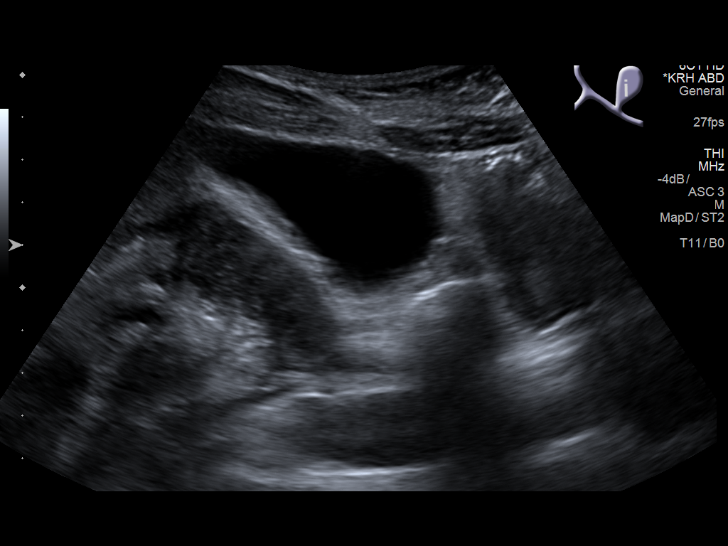
[im 14/42]
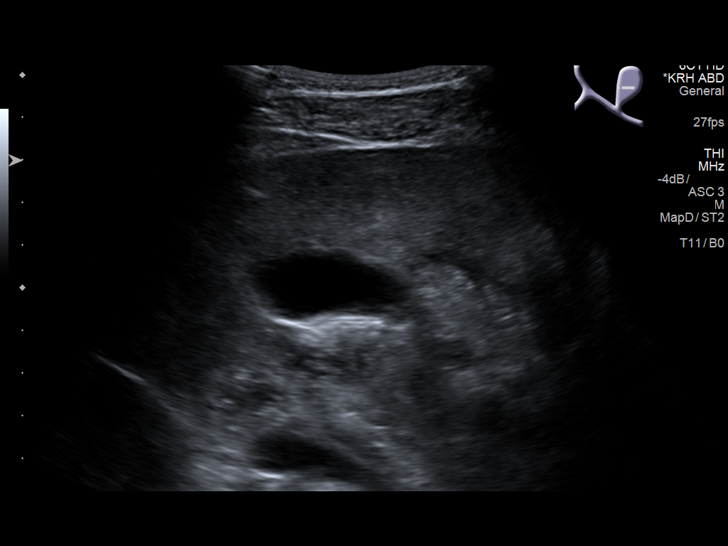
[im 16/42]
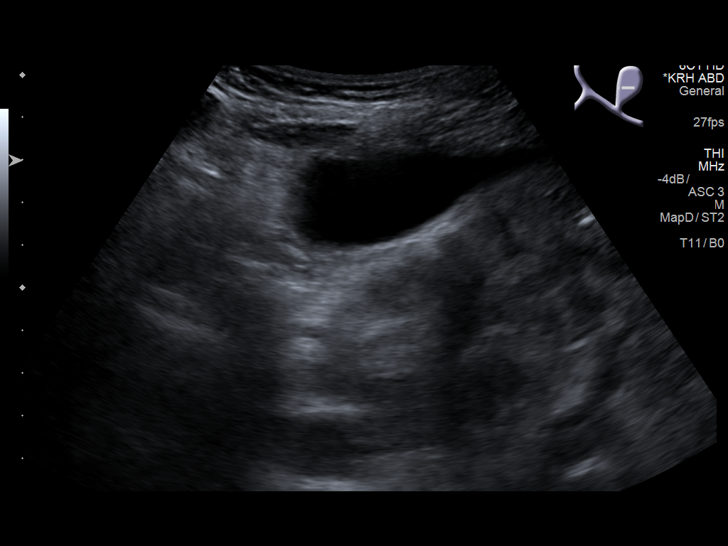
[im 19/42]
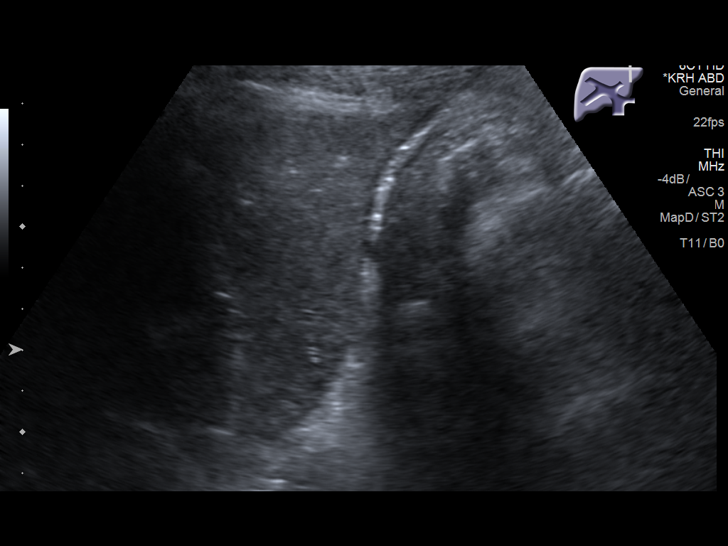
[im 23/42]
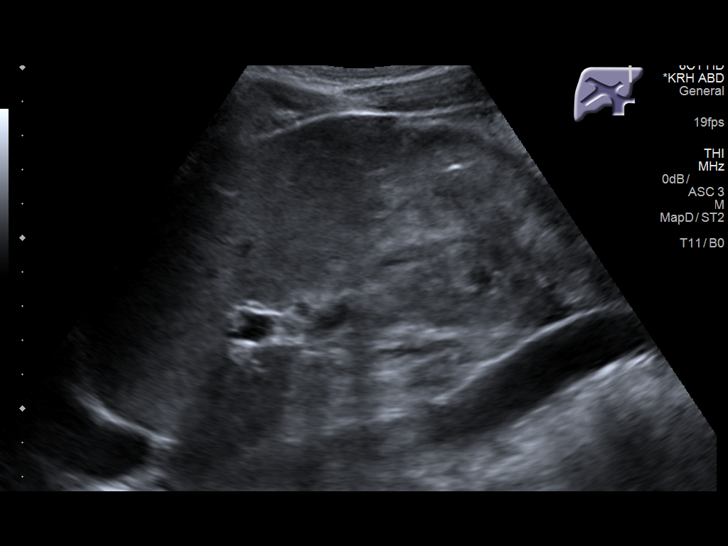
[im 26/42]
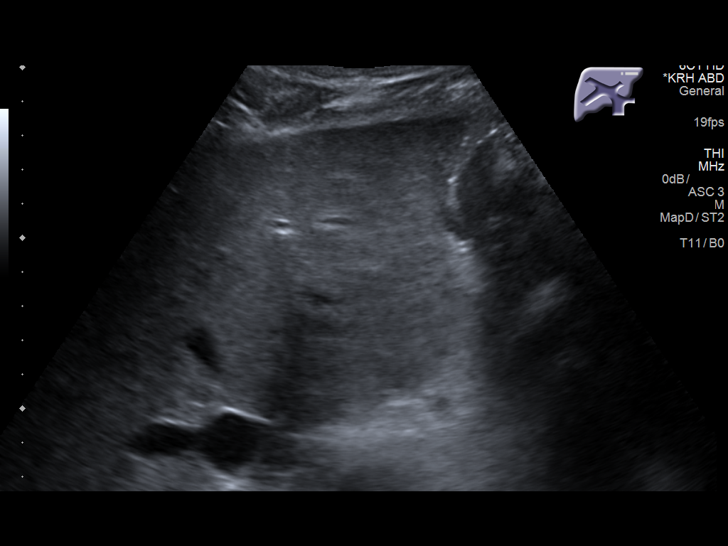
[im 28/42]
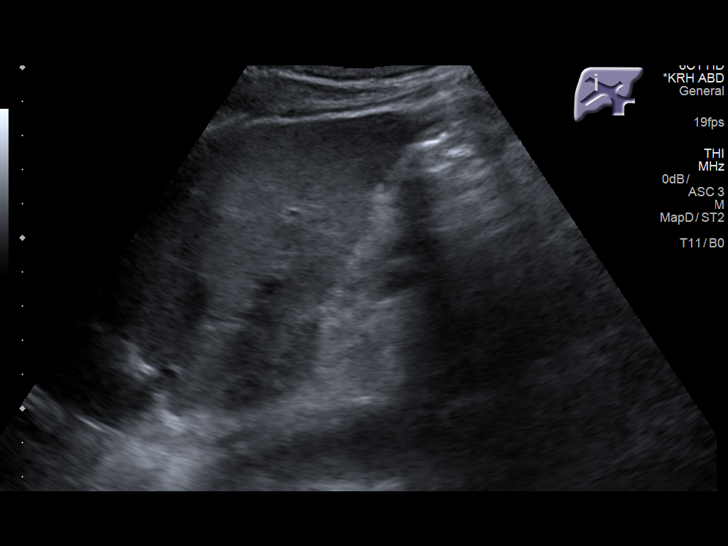
[im 31/42]
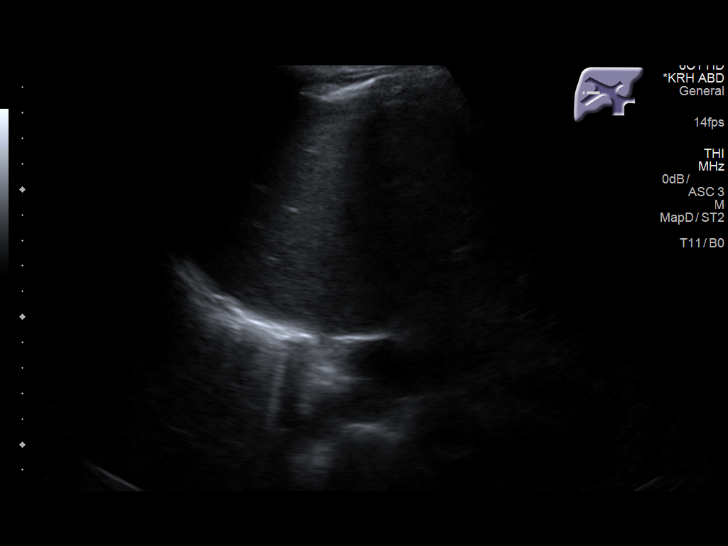
[im 35/42]
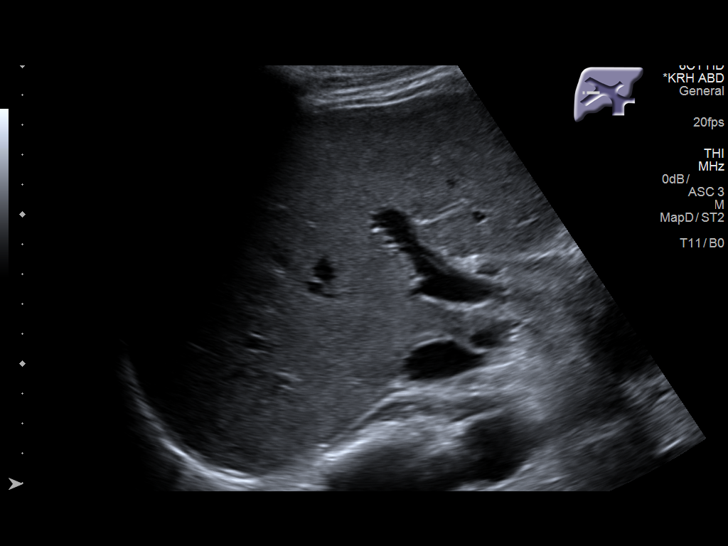
[im 38/42]
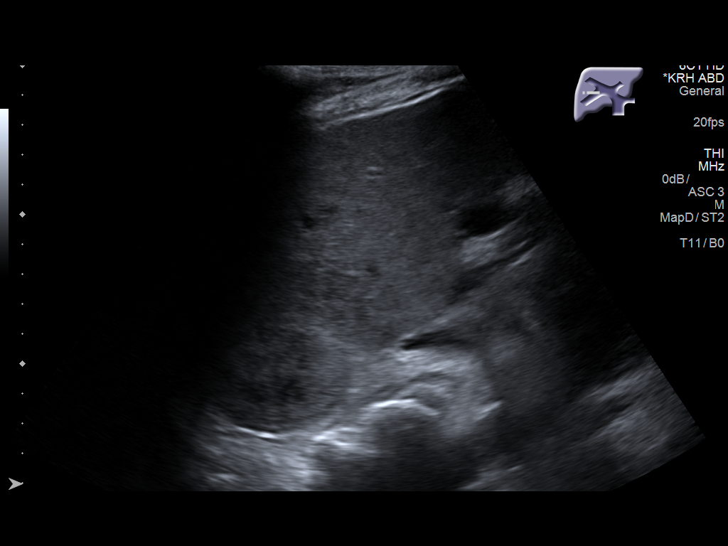
[im 42/42]
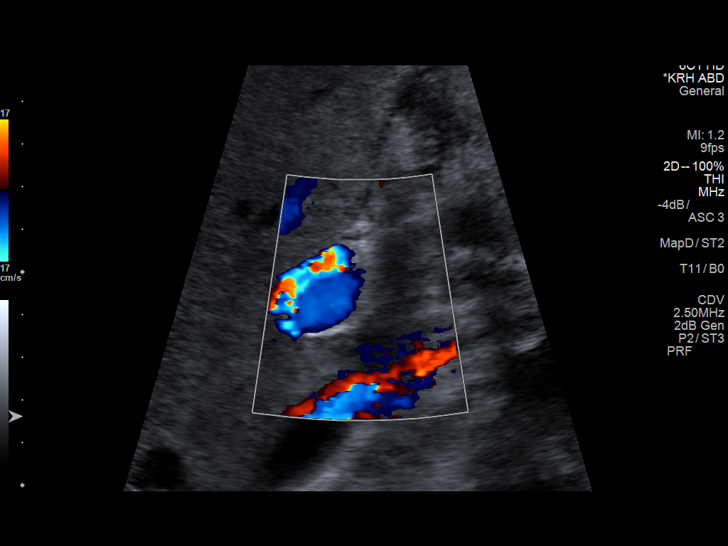

[14 of 25 positions shown; findings below may reference images not displayed]

FINDINGS: Gallbladder:

No gallstones or wall thickening visualized. No sonographic Murphy
sign noted by sonographer.

Common bile duct:

Diameter: 2 mm

Liver:

No focal lesion identified. Within normal limits in parenchymal
echogenicity.

Intermittently visualized cyst in the upper abdomen compatible with
known pseudocyst.
IMPRESSION: 1. Negative for gallstones or biliary dilatation
2. The patient's known pseudocyst is visualized on some of the
images.
